# Patient Record
Sex: Female | Born: 1960 | Race: White | Hispanic: No | Marital: Single | Smoking: Never smoker
Health system: Southern US, Community
[De-identification: ages and names within clinical notes are randomized; demographics above are authoritative.]

## PROBLEM LIST (undated history)

## (undated) DIAGNOSIS — C801 Malignant (primary) neoplasm, unspecified: Secondary | ICD-10-CM

## (undated) DIAGNOSIS — Z923 Personal history of irradiation: Secondary | ICD-10-CM

## (undated) DIAGNOSIS — E785 Hyperlipidemia, unspecified: Secondary | ICD-10-CM

## (undated) DIAGNOSIS — J45909 Unspecified asthma, uncomplicated: Secondary | ICD-10-CM

## (undated) HISTORY — DX: Unspecified asthma, uncomplicated: J45.909

## (undated) HISTORY — PX: THIGH / KNEE SOFT TISSUE BIOPSY: SUR151

## (undated) HISTORY — DX: Hyperlipidemia, unspecified: E78.5

## (undated) HISTORY — PX: COLONOSCOPY: SHX174

## (undated) HISTORY — PX: TONSILLECTOMY: SUR1361

## (undated) HISTORY — DX: Malignant (primary) neoplasm, unspecified: C80.1

## (undated) HISTORY — PX: KNEE ARTHROSCOPY: SUR90

---

## 1998-07-20 HISTORY — PX: VARICOSE VEIN SURGERY: SHX832

## 2009-07-20 DIAGNOSIS — C801 Malignant (primary) neoplasm, unspecified: Secondary | ICD-10-CM

## 2009-07-20 HISTORY — DX: Malignant (primary) neoplasm, unspecified: C80.1

## 2009-07-20 HISTORY — PX: LITHOTRIPSY: SUR834

## 2010-07-20 DIAGNOSIS — Z923 Personal history of irradiation: Secondary | ICD-10-CM

## 2010-07-20 HISTORY — PX: BREAST SURGERY: SHX581

## 2010-07-20 HISTORY — DX: Personal history of irradiation: Z92.3

## 2010-07-20 HISTORY — PX: BREAST LUMPECTOMY: SHX2

## 2014-06-28 ENCOUNTER — Other Ambulatory Visit (HOSPITAL_COMMUNITY): Payer: Self-pay | Admitting: *Deleted

## 2014-06-28 DIAGNOSIS — Z9889 Other specified postprocedural states: Secondary | ICD-10-CM

## 2014-07-17 ENCOUNTER — Encounter (HOSPITAL_COMMUNITY): Payer: Self-pay

## 2014-07-24 ENCOUNTER — Encounter (HOSPITAL_COMMUNITY): Payer: Self-pay

## 2014-08-07 ENCOUNTER — Ambulatory Visit (HOSPITAL_COMMUNITY)
Admission: RE | Admit: 2014-08-07 | Discharge: 2014-08-07 | Disposition: A | Discharge status: Home / Self Care | Payer: PRIVATE HEALTH INSURANCE | Source: Ambulatory Visit | Attending: *Deleted | Admitting: *Deleted

## 2014-08-07 DIAGNOSIS — Z9889 Other specified postprocedural states: Secondary | ICD-10-CM

## 2014-08-07 DIAGNOSIS — Z1231 Encounter for screening mammogram for malignant neoplasm of breast: Secondary | ICD-10-CM | POA: Insufficient documentation

## 2014-11-29 ENCOUNTER — Other Ambulatory Visit (HOSPITAL_COMMUNITY): Payer: Self-pay | Admitting: Physician Assistant

## 2014-11-29 DIAGNOSIS — R609 Edema, unspecified: Secondary | ICD-10-CM

## 2014-11-29 DIAGNOSIS — M25531 Pain in right wrist: Secondary | ICD-10-CM

## 2014-11-29 DIAGNOSIS — T1490XA Injury, unspecified, initial encounter: Secondary | ICD-10-CM

## 2014-12-10 ENCOUNTER — Ambulatory Visit (HOSPITAL_COMMUNITY)
Admission: RE | Admit: 2014-12-10 | Discharge: 2014-12-10 | Disposition: A | Discharge status: Home / Self Care | Payer: Self-pay | Source: Ambulatory Visit | Attending: Physician Assistant | Admitting: Physician Assistant

## 2014-12-10 DIAGNOSIS — R609 Edema, unspecified: Secondary | ICD-10-CM | POA: Insufficient documentation

## 2014-12-10 DIAGNOSIS — T1490XA Injury, unspecified, initial encounter: Secondary | ICD-10-CM

## 2014-12-10 DIAGNOSIS — M25531 Pain in right wrist: Secondary | ICD-10-CM | POA: Insufficient documentation

## 2015-01-22 ENCOUNTER — Ambulatory Visit (INDEPENDENT_AMBULATORY_CARE_PROVIDER_SITE_OTHER): Payer: Self-pay

## 2015-01-22 ENCOUNTER — Ambulatory Visit (INDEPENDENT_AMBULATORY_CARE_PROVIDER_SITE_OTHER): Payer: Self-pay | Admitting: Orthopedic Surgery

## 2015-01-22 VITALS — BP 104/64 | Ht 63.0 in | Wt 129.6 lb

## 2015-01-22 DIAGNOSIS — M778 Other enthesopathies, not elsewhere classified: Secondary | ICD-10-CM

## 2015-01-22 DIAGNOSIS — M25531 Pain in right wrist: Secondary | ICD-10-CM

## 2015-01-22 MED ORDER — IBUPROFEN 800 MG PO TABS: 800.0000 mg | ORAL_TABLET | Freq: Three times a day (TID) | ORAL | Status: DC

## 2015-01-22 NOTE — Patient Instructions (Signed)
Call [facility] therapy dept to arrange visit for splint

## 2015-01-22 NOTE — Progress Notes (Addendum)
Patient ID: Brenda Olsen, female   DOB: June 15, 1961, 54 y.o.   MRN: [id number] Patient ID: Brenda Olsen, female   DOB: [birth year], 54 y.o.   MRN: [id number]  New  Chief Complaint  Patient presents with  . Wrist Pain    eval right wrist pain and swelling     Brenda Olsen is a 54 y.o. female.   HPI Resents with history of right wrist pain, she was shoveling in the yard several weeks ago and started having pain over the volar aspect of the hand and wrist area near the scaphoid tubercle and thumb and FCR tendon. She has painful range of motion no numbness or tingling. Her pain is unrelieved by ibuprofen. She did wear a wrap but no splints  Review of systems is negative for fever, erythema, numbness or tingling. Review of Systems See hpi  System review seasonal allergy sinusitis breathing issues wheezing ankle leg edema fatigue dizziness tingling she says occasionally in the thumb  She's had a knee scope in 2001 and 2003, vein stripping 2000, tumor removed right thigh in 1970 for tonsils out and 61 and breast cancer surgery in 2012  Medical history asthma and pneumonia osteoporosis and fractures in the past  Medications are Ventolin Advair disc and lovastatin  Allergies to sulfa and latex  Family history diabetes asthma heart disease hypertension or textural blood clot CHF kidney disease depression cancer arthritis osteoporosis anesthesia issues and immune disorders  Does not smoke or drink   Social History History  Substance Use Topics  . Smoking status: Not on file  . Smokeless tobacco: Not on file  . Alcohol Use: Not on file    Allergies not on file  Current Outpatient Prescriptions  Medication Sig Dispense Refill  . albuterol (PROVENTIL HFA;VENTOLIN HFA) 108 (90 BASE) MCG/ACT inhaler Inhale into the lungs every 6 (six) hours as needed for wheezing or shortness of breath.    . Fluticasone-Salmeterol (ADVAIR) 250-50 MCG/DOSE AEPB Inhale 1 puff into the lungs 2 (two) times  daily.    Marland Kitchen lovastatin (MEVACOR) 20 MG tablet Take 20 mg by mouth at bedtime.    Marland Kitchen ibuprofen (ADVIL,MOTRIN) 800 MG tablet Take 1 tablet (800 mg total) by mouth 3 (three) times daily. 90 tablet 1   No current facility-administered medications for this visit.       Physical Exam Blood pressure 104/64, height 5\' 3"  (1.6 m), weight 129 lb 9.6 oz (58.786 kg). Physical Exam The patient is well developed well nourished and well groomed. Orientation to person place and time is normal  Mood is pleasant. Ambulatory status normal she has normal range of motion mild pain with wrist supination mild tenderness over the scaphoid tubercle without instability. The rest of the wrist is nontender the FCR tendon is tender as it crosses the wrist but not in the forearm. The skin looks normal the sensation is normal pulses are good lymph nodes are negative  Data Reviewed  I ordered an x-ray I read that as normal. She had an MRI looked at that I read that is normal as well the report says that there is some tendinitis picture in the  Assessment Encounter Diagnoses  Name Primary?  . Right wrist pain   . Right wrist tendonitis Yes   She will need to have the thumb and wrist splinted in neutral for 6 weeks, continue 800 mg ibuprofen  Follow-up 6 weeks Plan As above

## 2015-01-24 ENCOUNTER — Encounter (HOSPITAL_COMMUNITY): Payer: Self-pay

## 2015-01-24 ENCOUNTER — Ambulatory Visit (HOSPITAL_COMMUNITY): Payer: Self-pay | Attending: Orthopedic Surgery

## 2015-01-24 DIAGNOSIS — M778 Other enthesopathies, not elsewhere classified: Secondary | ICD-10-CM | POA: Insufficient documentation

## 2015-01-24 DIAGNOSIS — M25532 Pain in left wrist: Secondary | ICD-10-CM | POA: Insufficient documentation

## 2015-01-24 NOTE — Patient Instructions (Signed)
Your Splint This splint should initially be fitted by a healthcare practitioner.  The healthcare practitioner is responsible for providing wearing instructions and precautions to the patient, other healthcare practitioners and care provider involved in the patient's care.  This splint was custom made for you. Please read the following instructions to learn about wearing and caring for your splint.  Precautions Should your splint cause any of the following problems, remove the splint immediately and contact your therapist/physician.  Swelling  Severe Pain  Pressure Areas  Stiffness  Numbness  Do not wear your splint while operating machinery unless it has been fabricated for that purpose.  When To Wear Your Splint Where your splint according to your therapist/physician instructions. All the time. You may remove your splint periodically during the day just to move your wrist, hand, and fingers gently to prevent stiffness.   Care and Cleaning of Your Splint 1. Keep your splint away from open flames. 2. Your splint will lose its shape in temperatures over 135 degrees Farenheit, ( in car windows, near radiators, ovens or in hot water).  Never make any adjustments to your splint, if the splint needs adjusting remove it and make an appointment to see your therapist. 3. Your splint, including the cushion liner may be cleaned with soap and lukewarm water.  Do not immerse in hot water over 135 degrees Farenheit. 4. Straps may be washed with soap and water, but do not moisten the self-adhesive portion. 5. For ink or hard to remove spots use a scouring cleanser which contains chlorine.  Rinse the splint thoroughly after using chlorine cleanser. 6.

## 2015-01-24 NOTE — Therapy (Signed)
[place], [place], [address] Phone: [phone]   Fax:  [phone]  Occupational Therapy Evaluation  Patient Details  Name: Brenda Olsen MRN: [id number] Date of Birth: May 30, 1961 Referring Provider:  Carole Civil, MD  Encounter Date: 01/24/2015      OT End of Session - 01/24/15 1751    Visit Number 1   Number of Visits 1   Authorization Type [facility] (free program through [facility])   OT Start Time 1350   OT Stop Time 1440   OT Time Calculation (min) 50 min   Activity Tolerance Patient tolerated treatment well   Behavior During Therapy [facility] for tasks assessed/performed      History reviewed. No pertinent past medical history.  No past surgical history on file.  There were no vitals filed for this visit.  Visit Diagnosis:  Left wrist tendonitis - Plan: Ot plan of care cert/re-cert  Wrist pain, acute, left - Plan: Ot plan of care cert/re-cert      Subjective Assessment - 01/24/15 1742    Subjective  S: I was using a garden shovel and I think it was just from overuse.    Pertinent History Patient is a 54 y/o female s/p right wrist tendonitis caused from overuse when using a garden shovel while doing yardwork. This event happened several weeks ago. Dr. Aline Olsen has referred patient to occupational therapy for wrist splint fabrication.   Patient Stated Goals A splint to provide support to her wrist that she'll be able to wear daily without discomfort.    Currently in Pain? Yes   Pain Score 5    Pain Location Wrist   Pain Orientation Right   Pain Descriptors / Indicators Constant;Sharp   Pain Type Acute pain   Pain Onset 1 to 4 weeks ago   Pain Frequency Intermittent           [facility] OT Assessment - 01/24/15 1746    Assessment   Diagnosis Right wrist tendonitis   Precautions   Precautions None   Restrictions   Weight Bearing Restrictions No   Balance Screen   Has the patient fallen in the  past 6 months No   Written Expression   Dominant Hand Right   Vision - History   Baseline Vision No visual deficits   Cognition   Overall Cognitive Status Within Functional Limits for tasks assessed   ROM / Strength   AROM / PROM / Strength AROM   AROM   Overall AROM Comments Right wrist, elbow, and forearm AROM WNL in all ranges. Patient does report pain with all movement and use.    AROM Assessment Site Wrist;Forearm;Elbow                  OT Treatments/Exercises (OP) - 01/24/15 1750    Splinting   Splinting OTR/L fabricated a right volar thumb spica splint with wrist and thumb splinted in nuetral position. Pt was educated on donning/doffing, cleaning, and precautions. Pt verbalized understanding.                OT Education - 01/24/15 1749    Education provided Yes   Education Details Splint wearing schedule, donning/doffing, and care management.    Person(s) Educated Patient   Methods Explanation;Demonstration;Handout   Comprehension Verbalized understanding          OT Short Term Goals - 01/24/15 1755    OT SHORT TERM GOAL #1   Title Patient will receive  fabricated right thumb spica splint and verbalize understanding of donning/doffing, cleaning/care management, and precautions.    Time 1   Period Days   Status Achieved                  Plan - 01/24/15 1752    Clinical Impression Statement A: Patient is a 54 y/o female s/p right wrist tendonitis causing increased pain and discomfort when completing all BADL especially manipulating shift gear when driving.    Pt will benefit from skilled therapeutic intervention in order to improve on the following deficits (Retired) Pain   Rehab Potential Excellent   OT Frequency 1x / week   OT Duration --  1 week   OT Treatment/Interventions Splinting   Plan P: 1 time visit for splint fabrication. OTR/L will follow up with patient on Monday 01/28/15 before discharging from therapy.    Consulted and  Agree with Plan of Care Patient        Problem List There are no active problems to display for this patient.   Brenda Olsen, OTR/L,CBIS  [phone]  01/24/2015, 6:01 PM  [facility] [address] [place], [place], [address] Phone: [phone]   Fax:  [phone]

## 2015-01-29 ENCOUNTER — Telehealth (HOSPITAL_COMMUNITY): Payer: Self-pay

## 2015-01-29 NOTE — Telephone Encounter (Signed)
Date: 01/28/15  Called patient to follow up on thumb spica splint that was fabricated in clinic on 01/24/15. No answer. Unable to leave message due to no answering system.   Ailene Ravel, OTR/L,CBIS  [phone]

## 2015-01-29 NOTE — Telephone Encounter (Signed)
Date: 01/29/15 2nd attempt to call patient to follow up on splint fabrication. No answer. No answering system to leave message.   Ailene Ravel, OTR/L,CBIS  [phone]

## 2015-02-01 ENCOUNTER — Encounter (HOSPITAL_COMMUNITY): Payer: Self-pay

## 2015-02-01 NOTE — Therapy (Signed)
[place], [place], [address] Phone: [phone]   Fax:  [phone]  Patient Details  Name: Brenda Olsen MRN: [id number] Date of Birth: 04/04/1961 Referring Provider:  No ref. provider found  Encounter Date: 02/01/2015  Two attempts to call patient and follow up on splint fabrication for right wrist. No answer with both phone calls. Unable to leave a message; no messaging system in place. Will discharge patient this date. Patient has not called and voiced any problems as she was encouraged to do if she experienced any increased pain, swelling, pressure areas, etc.   Ailene Ravel, OTR/L,CBIS  [phone]  02/01/2015, 8:36 AM  [place], [place], [address] Phone: [phone]   Fax:  [phone]

## 2015-03-05 ENCOUNTER — Ambulatory Visit (INDEPENDENT_AMBULATORY_CARE_PROVIDER_SITE_OTHER): Payer: Self-pay | Admitting: Orthopedic Surgery

## 2015-03-05 DIAGNOSIS — M778 Other enthesopathies, not elsewhere classified: Secondary | ICD-10-CM

## 2015-03-05 NOTE — Progress Notes (Signed)
Patient ID: Brenda Olsen, female   DOB: 09-07-1960, 54 y.o.   MRN: [id number]  Follow up visit  Chief Complaint  Patient presents with  . Follow-up    6 wk follow up right wrist s/p splint    BP 112/66 mmHg  Ht 5\' 3"  (1.6 m)  Wt 129 lb 9.6 oz (58.786 kg)  BMI 22.96 kg/m2  Encounter Diagnosis  Name Primary?  . Right wrist tendonitis     The patient's tendinitis has improved she does have some pain over the insertion of the FCR but her exam is essentially benign except for that tenderness. She can remove her splint use Max Vergie Living as needed follow-up as needed

## 2015-03-05 NOTE — Patient Instructions (Signed)
Okay to remove brace  maxfreeze or biofreeze three times daily

## 2015-07-25 ENCOUNTER — Ambulatory Visit: Payer: Self-pay | Admitting: Physician Assistant

## 2015-08-13 ENCOUNTER — Ambulatory Visit: Payer: Self-pay | Admitting: Physician Assistant

## 2015-08-13 ENCOUNTER — Encounter: Payer: Self-pay | Admitting: Physician Assistant

## 2015-08-13 VITALS — BP 98/62 | HR 75 | Temp 97.7°F | Ht 63.0 in | Wt 136.0 lb

## 2015-08-13 DIAGNOSIS — Z1239 Encounter for other screening for malignant neoplasm of breast: Secondary | ICD-10-CM

## 2015-08-13 DIAGNOSIS — J45909 Unspecified asthma, uncomplicated: Secondary | ICD-10-CM

## 2015-08-13 DIAGNOSIS — E785 Hyperlipidemia, unspecified: Secondary | ICD-10-CM | POA: Insufficient documentation

## 2015-08-13 NOTE — Progress Notes (Signed)
BP 98/62 mmHg  Pulse 75  Temp(Src) 97.7 F (36.5 C)  Ht 5\' 3"  (1.6 m)  Wt 136 lb (61.689 kg)  BMI 24.10 kg/m2  SpO2 97%   Subjective:    Patient ID: Brenda Olsen, female    DOB: [birth year], 55 y.o.   MRN: [id number]  HPI: Brenda Olsen is a 55 y.o. female presenting on 08/13/2015 for Hyperlipidemia   HPI   Pt says she is doing well.  She is still living and caring for her mother.  She says her asthma is well-controlled and she isn't having problems with sob or wheezing.  Pt did not get her labs drawn.  Relevant past medical, surgical, family and social history reviewed and updated as indicated. Interim medical history since our last visit reviewed. Allergies and medications reviewed and updated.  Current outpatient prescriptions:  .  albuterol (PROVENTIL HFA;VENTOLIN HFA) 108 (90 BASE) MCG/ACT inhaler, Inhale into the lungs every 6 (six) hours as needed for wheezing or shortness of breath., Disp: , Rfl:  .  Fluticasone-Salmeterol (ADVAIR) 250-50 MCG/DOSE AEPB, Inhale 1 puff into the lungs 2 (two) times daily., Disp: , Rfl:  .  ibuprofen (ADVIL,MOTRIN) 800 MG tablet, Take 1 tablet (800 mg total) by mouth 3 (three) times daily. (Patient taking differently: Take 800 mg by mouth as needed (joint pain). ), Disp: 90 tablet, Rfl: 1 .  lovastatin (MEVACOR) 20 MG tablet, Take 20 mg by mouth at bedtime., Disp: , Rfl:    Review of Systems  Constitutional: Negative for fever, chills, diaphoresis, appetite change, fatigue and unexpected weight change.  HENT: Negative for congestion, dental problem, drooling, ear pain, facial swelling, hearing loss, mouth sores, sneezing, sore throat, trouble swallowing and voice change.   Eyes: Negative for pain, discharge, redness, itching and visual disturbance.  Respiratory: Negative for cough, choking, shortness of breath and wheezing.   Cardiovascular: Negative for chest pain, palpitations and leg swelling.  Gastrointestinal: Negative for vomiting,  abdominal pain, diarrhea, constipation and blood in stool.  Endocrine: Negative for cold intolerance, heat intolerance and polydipsia.  Genitourinary: Negative for dysuria, hematuria and decreased urine volume.  Musculoskeletal: Positive for back pain and arthralgias. Negative for gait problem.  Skin: Negative for rash.  Allergic/Immunologic: Positive for environmental allergies.  Neurological: Negative for seizures, syncope, light-headedness and headaches.  Hematological: Negative for adenopathy.  Psychiatric/Behavioral: Negative for suicidal ideas, dysphoric mood and agitation. The patient is not nervous/anxious.     Per HPI unless specifically indicated above     Objective:    BP 98/62 mmHg  Pulse 75  Temp(Src) 97.7 F (36.5 C)  Ht 5\' 3"  (1.6 m)  Wt 136 lb (61.689 kg)  BMI 24.10 kg/m2  SpO2 97%  Wt Readings from Last 3 Encounters:  08/13/15 136 lb (61.689 kg)  03/05/15 129 lb 9.6 oz (58.786 kg)  01/22/15 129 lb 9.6 oz (58.786 kg)    Physical Exam  Constitutional: She is oriented to person, place, and time. She appears well-developed and well-nourished.  HENT:  Head: Normocephalic and atraumatic.  Neck: Neck supple.  Cardiovascular: Normal rate and regular rhythm.   Pulmonary/Chest: Effort normal and breath sounds normal. No respiratory distress. She has no wheezes.  Abdominal: Soft. Bowel sounds are normal. She exhibits no distension and no mass. There is no tenderness.  Musculoskeletal: She exhibits no edema.  Lymphadenopathy:    She has no cervical adenopathy.  Neurological: She is alert and oriented to person, place, and time.  Skin: Skin is warm and  dry.  Psychiatric: She has a normal mood and affect. Her behavior is normal.  Vitals reviewed.   No results found for this or any previous visit.    Assessment & Plan:   Encounter Diagnoses  Name Primary?  . Hyperlipidemia Yes  . Asthma, chronic, unspecified asthma severity, uncomplicated   . Screening for  breast cancer     -order mammogram -encouraged pt to sign up for PAP screening at free clinic held at [facility] -get fasting labs drawn tomorrow. Will call with results. -continue current medications -f/u 4 months.  RTO sooner prn

## 2015-08-14 ENCOUNTER — Other Ambulatory Visit: Payer: Self-pay | Admitting: Physician Assistant

## 2015-08-14 LAB — COMPREHENSIVE METABOLIC PANEL
ALT: 26 U/L (ref 6–29)
AST: 24 U/L (ref 10–35)
Albumin: 3.8 g/dL (ref 3.6–5.1)
Alkaline Phosphatase: 74 U/L (ref 33–130)
BUN: 18 mg/dL (ref 7–25)
CALCIUM: 9 mg/dL (ref 8.6–10.4)
CO2: 27 mmol/L (ref 20–31)
Chloride: 104 mmol/L (ref 98–110)
Creat: 0.79 mg/dL (ref 0.50–1.05)
Glucose, Bld: 88 mg/dL (ref 65–99)
POTASSIUM: 4.2 mmol/L (ref 3.5–5.3)
Sodium: 140 mmol/L (ref 135–146)
Total Bilirubin: 1.5 mg/dL — ABNORMAL HIGH (ref 0.2–1.2)
Total Protein: 6.5 g/dL (ref 6.1–8.1)

## 2015-08-14 LAB — LIPID PANEL
Cholesterol: 201 mg/dL — ABNORMAL HIGH (ref 125–200)
HDL: 76 mg/dL (ref >=46)
LDL CALC: 112 mg/dL (ref <130)
TRIGLYCERIDES: 66 mg/dL (ref <150)
Total CHOL/HDL Ratio: 2.6 Ratio (ref <=5.0)
VLDL: 13 mg/dL (ref <30)

## 2015-09-02 ENCOUNTER — Other Ambulatory Visit: Payer: Self-pay | Admitting: Physician Assistant

## 2015-09-02 MED ORDER — FLUTICASONE-SALMETEROL 250-50 MCG/DOSE IN AEPB: 1.0000 | INHALATION_SPRAY | Freq: Two times a day (BID) | RESPIRATORY_TRACT | Status: DC

## 2015-09-10 ENCOUNTER — Other Ambulatory Visit (HOSPITAL_COMMUNITY): Payer: Self-pay | Admitting: *Deleted

## 2015-09-10 DIAGNOSIS — Z9889 Other specified postprocedural states: Secondary | ICD-10-CM

## 2015-09-17 ENCOUNTER — Encounter: Payer: Self-pay | Admitting: Physician Assistant

## 2015-09-17 ENCOUNTER — Ambulatory Visit (HOSPITAL_COMMUNITY)
Admission: RE | Admit: 2015-09-17 | Discharge: 2015-09-17 | Disposition: A | Discharge status: Home / Self Care | Payer: PRIVATE HEALTH INSURANCE | Source: Ambulatory Visit | Attending: Physician Assistant | Admitting: Physician Assistant

## 2015-09-17 ENCOUNTER — Ambulatory Visit: Payer: Self-pay | Admitting: Physician Assistant

## 2015-09-17 ENCOUNTER — Ambulatory Visit (HOSPITAL_COMMUNITY)
Admission: RE | Admit: 2015-09-17 | Discharge: 2015-09-17 | Disposition: A | Discharge status: Home / Self Care | Payer: PRIVATE HEALTH INSURANCE | Source: Ambulatory Visit | Attending: *Deleted | Admitting: *Deleted

## 2015-09-17 VITALS — BP 114/60 | HR 79 | Temp 98.1°F | Ht 63.0 in | Wt 136.2 lb

## 2015-09-17 DIAGNOSIS — Z853 Personal history of malignant neoplasm of breast: Secondary | ICD-10-CM | POA: Insufficient documentation

## 2015-09-17 DIAGNOSIS — S99921A Unspecified injury of right foot, initial encounter: Secondary | ICD-10-CM | POA: Diagnosis not present

## 2015-09-17 DIAGNOSIS — W228XXA Striking against or struck by other objects, initial encounter: Secondary | ICD-10-CM | POA: Insufficient documentation

## 2015-09-17 DIAGNOSIS — Y929 Unspecified place or not applicable: Secondary | ICD-10-CM | POA: Insufficient documentation

## 2015-09-17 DIAGNOSIS — Z9889 Other specified postprocedural states: Secondary | ICD-10-CM

## 2015-09-17 DIAGNOSIS — M79671 Pain in right foot: Secondary | ICD-10-CM

## 2015-09-17 DIAGNOSIS — Z923 Personal history of irradiation: Secondary | ICD-10-CM | POA: Insufficient documentation

## 2015-09-17 NOTE — Progress Notes (Signed)
   BP 114/60 mmHg  Pulse 79  Temp(Src) 98.1 F (36.7 C)  Ht 5\' 3"  (1.6 m)  Wt 136 lb 3.2 oz (61.78 kg)  BMI 24.13 kg/m2  SpO2 99%   Subjective:    Patient ID: Brenda Olsen, female    DOB: 10-18-1960, 55 y.o.   MRN: [id number]  HPI: Brenda Olsen is a 55 y.o. female presenting on 09/17/2015 for Foot Pain   HPI Chief Complaint  Patient presents with  . Foot Pain    w/ swelling. R foot    Pt dropped a piece of wood on her foot.  This happened one day last week.  Relevant past medical, surgical, family and social history reviewed and updated as indicated. Interim medical history since our last visit reviewed. Allergies and medications reviewed and updated.   Current outpatient prescriptions:  .  albuterol (PROVENTIL HFA;VENTOLIN HFA) 108 (90 BASE) MCG/ACT inhaler, Inhale into the lungs every 6 (six) hours as needed for wheezing or shortness of breath., Disp: , Rfl:  .  Fluticasone-Salmeterol (ADVAIR) 250-50 MCG/DOSE AEPB, Inhale 1 puff into the lungs 2 (two) times daily., Disp: 180 each, Rfl: 3 .  lovastatin (MEVACOR) 20 MG tablet, Take 20 mg by mouth at bedtime., Disp: , Rfl:    Review of Systems  Constitutional: Negative for fever, chills, diaphoresis, appetite change, fatigue and unexpected weight change.  HENT: Negative for congestion, dental problem, drooling, ear pain, facial swelling, hearing loss, mouth sores, sneezing, sore throat, trouble swallowing and voice change.   Eyes: Negative for pain, discharge, redness, itching and visual disturbance.  Respiratory: Negative for cough, choking, shortness of breath and wheezing.   Cardiovascular: Negative for chest pain, palpitations and leg swelling.  Gastrointestinal: Negative for vomiting, abdominal pain, diarrhea, constipation and blood in stool.  Endocrine: Negative for cold intolerance, heat intolerance and polydipsia.  Genitourinary: Negative for dysuria, hematuria and decreased urine volume.  Musculoskeletal: Positive  for arthralgias and gait problem. Negative for back pain.  Skin: Negative for rash.  Allergic/Immunologic: Positive for environmental allergies.  Neurological: Negative for seizures, syncope, light-headedness and headaches.  Hematological: Negative for adenopathy.  Psychiatric/Behavioral: Negative for suicidal ideas, dysphoric mood and agitation. The patient is not nervous/anxious.     Per HPI unless specifically indicated above     Objective:    BP 114/60 mmHg  Pulse 79  Temp(Src) 98.1 F (36.7 C)  Ht 5\' 3"  (1.6 m)  Wt 136 lb 3.2 oz (61.78 kg)  BMI 24.13 kg/m2  SpO2 99%  Wt Readings from Last 3 Encounters:  09/17/15 136 lb 3.2 oz (61.78 kg)  08/13/15 136 lb (61.689 kg)  03/05/15 129 lb 9.6 oz (58.786 kg)    Physical Exam  Constitutional: She is oriented to person, place, and time. She appears well-developed and well-nourished.  Musculoskeletal:       Right foot: There is tenderness and bony tenderness. There is normal range of motion, no swelling and normal capillary refill.  Neurological: She is alert and oriented to person, place, and time.  Psychiatric: She has a normal mood and affect. Her behavior is normal.  Vitals reviewed.       Assessment & Plan:     Encounter Diagnosis  Name Primary?  . Right foot pain Yes   -Xray the foot to r/o fx -Pt is given [facility] discount application. -Pt counseled to ice, elevate, ibuprofen.  Also wear good supportive shoe like a sneaker

## 2015-09-18 ENCOUNTER — Other Ambulatory Visit: Payer: Self-pay | Admitting: Physician Assistant

## 2015-11-07 ENCOUNTER — Other Ambulatory Visit: Payer: Self-pay | Admitting: Physician Assistant

## 2015-11-07 MED ORDER — ALBUTEROL SULFATE HFA 108 (90 BASE) MCG/ACT IN AERS: 2.0000 | INHALATION_SPRAY | Freq: Four times a day (QID) | RESPIRATORY_TRACT | Status: DC | PRN

## 2015-12-11 ENCOUNTER — Encounter: Payer: Self-pay | Admitting: Physician Assistant

## 2015-12-11 ENCOUNTER — Ambulatory Visit: Payer: Self-pay | Admitting: Physician Assistant

## 2015-12-11 VITALS — BP 100/60 | HR 87 | Temp 97.5°F | Ht 63.0 in | Wt 137.2 lb

## 2015-12-11 DIAGNOSIS — M79671 Pain in right foot: Secondary | ICD-10-CM

## 2015-12-11 DIAGNOSIS — J45909 Unspecified asthma, uncomplicated: Secondary | ICD-10-CM

## 2015-12-11 DIAGNOSIS — E785 Hyperlipidemia, unspecified: Secondary | ICD-10-CM

## 2015-12-11 NOTE — Patient Instructions (Signed)
Record request for colonoscopy report- refer to GI for repeat colonoscopy when report comes in  Labs- call results Will have nurse contact A[facility]-= pt turned in CD before xray Repeat xray foot- if good will send to Physical therapy -f/u 4 mo- PAP at that appt

## 2015-12-11 NOTE — Progress Notes (Signed)
BP 100/60 mmHg  Pulse 87  Temp(Src) 97.5 F (36.4 C)  Ht 5\' 3"  (1.6 m)  Wt 137 lb 3.2 oz (62.234 kg)  BMI 24.31 kg/m2  SpO2 97%   Subjective:    Patient ID: Brenda Olsen, female    DOB: [birth year], 55 y.o.   MRN: [id number]  HPI: Brenda Olsen is a 55 y.o. female presenting on 12/11/2015 for Asthma and Hyperlipidemia   HPI   Pt had colonoscopy at age 72.  She says she had several polyps.   She says she tried to have PAP done earlier this year but the person "was having a hard time" and so it wasn't done.    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 Interim medical history since our last visit reviewed. Allergies and medications reviewed and updated.   Current outpatient prescriptions:  .  albuterol (PROVENTIL HFA;VENTOLIN HFA) 108 (90 Base) MCG/ACT inhaler, Inhale 2 puffs into the lungs every 6 (six) hours as needed for wheezing or shortness of breath., Disp: 3 Inhaler, Rfl: 3 .  Fluticasone-Salmeterol (ADVAIR) 250-50 MCG/DOSE AEPB, Inhale 1 puff into the lungs 2 (two) times daily., Disp: 180 each, Rfl: 3 .  Ibuprofen 200 MG CAPS, Take 400 mg by mouth 2 (two) times daily as needed., Disp: , Rfl:  .  lovastatin (MEVACOR) 20 MG tablet, TAKE ONE TABLET BY MOUTH ONCE DAILY AT BEDTIME FOR CHOLESTEROL, Disp: 30 tablet, Rfl: 6   Review of Systems  Constitutional: Negative for fever, chills, diaphoresis, appetite change, fatigue and unexpected weight change.  HENT: Negative for congestion, dental problem, drooling, ear pain, facial swelling, hearing loss, mouth sores, sneezing, sore throat, trouble swallowing and voice change.   Eyes: Negative for pain, discharge, redness, itching and visual disturbance.  Respiratory: Negative for cough, choking, shortness of breath and wheezing.   Cardiovascular: Negative for chest pain, palpitations and leg swelling.  Gastrointestinal: Negative  for vomiting, abdominal pain, diarrhea, constipation and blood in stool.  Endocrine: Negative for cold intolerance, heat intolerance and polydipsia.  Genitourinary: Negative for dysuria, hematuria and decreased urine volume.  Musculoskeletal: Positive for arthralgias and gait problem. Negative for back pain.  Skin: Negative for rash.  Allergic/Immunologic: Positive for environmental allergies.  Neurological: Negative for seizures, syncope, light-headedness and headaches.  Hematological: Negative for adenopathy.  Psychiatric/Behavioral: Negative for suicidal ideas, dysphoric mood and agitation. The patient is not nervous/anxious.     Per HPI unless specifically indicated above     Objective:    BP 100/60 mmHg  Pulse 87  Temp(Src) 97.5 F (36.4 C)  Ht 5\' 3"  (1.6 m)  Wt 137 lb 3.2 oz (62.234 kg)  BMI 24.31 kg/m2  SpO2 97%  Wt Readings from Last 3 Encounters:  12/11/15 137 lb 3.2 oz (62.234 kg)  09/17/15 136 lb 3.2 oz (61.78 kg)  08/13/15 136 lb (61.689 kg)    Physical Exam  Constitutional: She is oriented to person, place, and time. She appears well-developed and well-nourished.  HENT:  Head: Normocephalic and atraumatic.  Neck: Neck supple.  Cardiovascular: Normal rate and regular rhythm.   Pulmonary/Chest: Effort normal and breath sounds normal.  Abdominal: Soft. Bowel sounds are normal. She exhibits no mass. There is no hepatosplenomegaly. There is no tenderness.  Musculoskeletal: She exhibits no edema.  Lymphadenopathy:    She has no cervical adenopathy.  Neurological: She is alert and oriented to person, place, and time.  Skin: Skin is warm and dry.  Psychiatric: She has a normal mood and affect. Her behavior is normal.  Vitals reviewed.       Assessment & Plan:   Encounter Diagnoses  Name Primary?  . Hyperlipidemia Yes  . Asthma, chronic, unspecified asthma severity, uncomplicated   . Right foot pain      -Record request for colonoscopy report sent- Will  refer to GI for repeat colonoscopy when report comes in  -pt to get Labs drawn this week- Will call results -Will have nurse contact [name] about [facility] discount application.  pt turned in CD before xray but says she hasn't heard on it yet -Repeat xray foot- if good will send to Physical therapy -f/u 4 months- for cholesterol and  PAP at that appointment

## 2015-12-12 ENCOUNTER — Other Ambulatory Visit: Payer: Self-pay | Admitting: Physician Assistant

## 2015-12-12 ENCOUNTER — Ambulatory Visit (HOSPITAL_COMMUNITY)
Admission: RE | Admit: 2015-12-12 | Discharge: 2015-12-12 | Disposition: A | Discharge status: Home / Self Care | Payer: Self-pay | Source: Ambulatory Visit | Attending: Physician Assistant | Admitting: Physician Assistant

## 2015-12-12 DIAGNOSIS — Z86010 Personal history of colonic polyps: Secondary | ICD-10-CM

## 2015-12-12 DIAGNOSIS — M79671 Pain in right foot: Secondary | ICD-10-CM | POA: Insufficient documentation

## 2015-12-12 LAB — LIPID PANEL
CHOL/HDL RATIO: 2.5 ratio (ref <=5.0)
Cholesterol: 185 mg/dL (ref 125–200)
HDL: 74 mg/dL (ref >=46)
LDL Cholesterol: 98 mg/dL (ref <130)
Triglycerides: 63 mg/dL (ref <150)
VLDL: 13 mg/dL (ref <30)

## 2015-12-12 LAB — COMPLETE METABOLIC PANEL WITH GFR
ALT: 28 U/L (ref 6–29)
AST: 25 U/L (ref 10–35)
Albumin: 3.8 g/dL (ref 3.6–5.1)
Alkaline Phosphatase: 68 U/L (ref 33–130)
BUN: 15 mg/dL (ref 7–25)
CO2: 25 mmol/L (ref 20–31)
Calcium: 9.1 mg/dL (ref 8.6–10.4)
Chloride: 108 mmol/L (ref 98–110)
Creat: 0.8 mg/dL (ref 0.50–1.05)
GFR, Est African American: >89 mL/min (ref >=60)
GFR, Est Non African American: 83 mL/min (ref >=60)
Glucose, Bld: 92 mg/dL (ref 65–99)
Potassium: 4.3 mmol/L (ref 3.5–5.3)
Sodium: 142 mmol/L (ref 135–146)
Total Bilirubin: 1.2 mg/dL (ref 0.2–1.2)
Total Protein: 6.3 g/dL (ref 6.1–8.1)

## 2015-12-15 ENCOUNTER — Other Ambulatory Visit: Payer: Self-pay | Admitting: Physician Assistant

## 2015-12-15 DIAGNOSIS — M79671 Pain in right foot: Secondary | ICD-10-CM

## 2015-12-17 ENCOUNTER — Encounter: Payer: Self-pay | Admitting: Gastroenterology

## 2015-12-30 ENCOUNTER — Ambulatory Visit: Payer: Self-pay | Admitting: Nurse Practitioner

## 2016-01-02 ENCOUNTER — Encounter: Payer: Self-pay | Admitting: Physician Assistant

## 2016-01-15 ENCOUNTER — Ambulatory Visit: Payer: Self-pay | Admitting: Nurse Practitioner

## 2016-02-11 ENCOUNTER — Ambulatory Visit (INDEPENDENT_AMBULATORY_CARE_PROVIDER_SITE_OTHER): Payer: Self-pay | Admitting: Nurse Practitioner

## 2016-02-11 ENCOUNTER — Encounter: Payer: Self-pay | Admitting: Nurse Practitioner

## 2016-02-11 ENCOUNTER — Other Ambulatory Visit: Payer: Self-pay

## 2016-02-11 VITALS — BP 100/66 | HR 85 | Temp 98.1°F | Ht 62.0 in | Wt 135.0 lb

## 2016-02-11 DIAGNOSIS — Z1211 Encounter for screening for malignant neoplasm of colon: Secondary | ICD-10-CM

## 2016-02-11 DIAGNOSIS — K219 Gastro-esophageal reflux disease without esophagitis: Secondary | ICD-10-CM

## 2016-02-11 DIAGNOSIS — R1310 Dysphagia, unspecified: Secondary | ICD-10-CM

## 2016-02-11 MED ORDER — OMEPRAZOLE 20 MG PO CPDR: 20.0000 mg | DELAYED_RELEASE_CAPSULE | Freq: Every day | ORAL | 3 refills | Status: DC

## 2016-02-11 NOTE — Progress Notes (Addendum)
REVIEWED-NO ADDITIONAL RECOMMENDATIONS.  Primary Care Physician:  Soyla Dryer, PA-C Primary Gastroenterologist:  Dr. Oneida Alar  Chief Complaint  Patient presents with  . Colonoscopy    Hx of polyps    HPI:   [name] is a 55 y.o. female who presents On referral from primary care for ectasia colonoscopy due to a history of colon polyps. PCP notes reviewed. An last saw PCP 12/11/2015 at which point it was determined her records to be requested and referred to GI for follow-up one last colonoscopy report was received. Reviewed previous colonoscopy report completed 12/11/2010 at [facility] in [place], [place]. Findings include 2 sessile polyps at the hepatic flexure, sessile polyp in the ascending colon. EGD completed the same time found a few erosions in the lower third of the esophagus and GE junction status post biopsy, hiatal hernia, nonbleeding erosive gastropathy status post biopsy, antral gastric mucosal abnormality status post biopsy, normal examined duodenum, Widely patent Schatzki's ring. Surgical pathology found the colon polyps to be tubular adenoma, antral stomach biopsy mild reactive gastropathy, GE junction biopsy gastric type mucosa with mild chronic inflammation without Barrett's esophagus.  Today she states she's doing well overall. Takes Ibuprofen or baby ASA almost every day. Has GERD symptoms regularly usually couple times a week, takes [place]. Admits dysphagia symptoms about 2-3 times a month. Denies abdominal pain, hematochezia, melena, unintentional weight loss, fever, chills, acute changes in bowel habits. Denies chest pain, dyspnea, dizziness, lightheadedness, syncope, near syncope. Denies any other upper or lower GI symptoms.  Past Medical History:  Diagnosis Date  . Asthma   . Cancer [facility]) 2011   breast  . Hyperlipidemia     Past Surgical History:  Procedure Laterality Date  . BREAST SURGERY Left 2012   lumpectomy- followed by radiation x 6 wk    . COLONOSCOPY  age 86  . KNEE ARTHROSCOPY Bilateral 2001, 2003  . LITHOTRIPSY  2011  . THIGH / KNEE SOFT TISSUE BIOPSY Right 55 yo   benign tumor removed L thigh  . TONSILLECTOMY  childhood  . VARICOSE VEIN SURGERY Right 2000    Current Outpatient Prescriptions  Medication Sig Dispense Refill  . albuterol (PROVENTIL HFA;VENTOLIN HFA) 108 (90 Base) MCG/ACT inhaler Inhale 2 puffs into the lungs every 6 (six) hours as needed for wheezing or shortness of breath. 3 Inhaler 3  . Fluticasone-Salmeterol (ADVAIR) 250-50 MCG/DOSE AEPB Inhale 1 puff into the lungs 2 (two) times daily. 180 each 3  . Ibuprofen 200 MG CAPS Take 400 mg by mouth 2 (two) times daily as needed.    . lovastatin (MEVACOR) 20 MG tablet TAKE ONE TABLET BY MOUTH ONCE DAILY AT BEDTIME FOR CHOLESTEROL 30 tablet 6   No current facility-administered medications for this visit.     Allergies as of 02/11/2016 - Review Complete 02/11/2016  Allergen Reaction Noted  . Sulfa antibiotics Hives 08/13/2015    Family History  Problem Relation Age of Onset  . Scleroderma Mother   . Lupus Mother   . Heart disease Mother   . Alzheimer's disease Father   . Heart disease Brother   . Stroke Brother   . Cancer Brother     lung  . Colon cancer Neg Hx     Social History   Social History  . Marital status: Single    Spouse name: N/A  . Number of children: N/A  . Years of education: N/A   Occupational History  . Not on file.   Social History Main Topics  .  Smoking status: Never Smoker  . Smokeless tobacco: Never Used  . Alcohol use No  . Drug use: No  . Sexual activity: Not on file   Other Topics Concern  . Not on file   Social History Narrative  . No narrative on file    Review of Systems: 10-point ROS negative except as per HPI.    Physical Exam: BP 100/66   Pulse 85   Temp 98.1 F (36.7 C) (Oral)   Ht 5\' 2"  (1.575 m)   Wt 135 lb (61.2 kg)   BMI 24.69 kg/m  General:   Alert and oriented. Pleasant and  cooperative. Well-nourished and well-developed.  Head:  Normocephalic and atraumatic.  Ears:  Normal auditory acuity. Mouth:  No deformity or lesions, oral mucosa pink. Oropharynx widely patent. Throat/Neck:  Supple, without mass or thyromegaly. Cardiovascular:  S1, S2 present without murmurs appreciated. Extremities without clubbing or edema. Respiratory:  Clear to auscultation bilaterally. No wheezes, rales, or rhonchi. No distress.  Gastrointestinal:  +BS, soft, non-tender and non-distended. No HSM noted. No guarding or rebound. No masses appreciated.  Rectal:  Deferred  Musculoskalatal:  Symmetrical without gross deformities. Neurologic:  Alert and oriented x4;  grossly normal neurologically. Psych:  Alert and cooperative. Normal mood and affect. Heme/Lymph/Immune: No excessive bruising noted.    02/11/2016 8:34 AM   Disclaimer: This note was dictated with voice recognition software. Similar sounding words can inadvertently be transcribed and may not be corrected upon review.

## 2016-02-11 NOTE — Assessment & Plan Note (Signed)
Patient with complaints of dysphagia. Last endoscopy report completed in 2012 found widely patent Schatzki's ring not dilated. Given persistent GERD, regular use of NSAIDs her dysphagia is likely due to uncontrolled esophagitis/GERD, however cannot rule out stricture, web, ring, mass. No Barrett's esophagus on her last endoscopy. We will had endoscopy to her needed colonoscopy. Return for follow-up in 3 months.  Proceed with EGD +/- dilation with Dr. Oneida Alar in near future: the risks, benefits, and alternatives have been discussed with the patient in detail. The patient states understanding and desires to proceed.  The patient is not on any anticoagulants, anxiolytics, chronic pain medications, or antidepressants. Conscious sedation should be adequate for her procedure.

## 2016-02-11 NOTE — Assessment & Plan Note (Signed)
Patient due for repeat colonoscopy. Last colonoscopy found 3 sessile polyps which were found to be tubular adenoma on surgical pathology with recommended 5 year repeat. She is currently due. She is generally asymptomatic from a lower GI standpoint. We will proceed with screening colonoscopy as recommended. Return for follow-up in 3 months for upper GI symptom progression as noted above.  Proceed with colonoscopy with Dr. Oneida Alar in the near future. The risks, benefits, and alternatives have been discussed in detail with the patient. They state understanding and desire to proceed.   The patient is not on any anticoagulants, anxiolytics, chronic pain medications, or antidepressants. Conscious sedation should be adequate for her procedure.

## 2016-02-11 NOTE — Patient Instructions (Signed)
1. I send Prilosec to your pharmacy. Take it once a day, 30 minutes before your first meal the day. 2. As we discussed, try to avoid all "NSAID" medications. These are generally over-the-counter pain medications except Tylenol. Any medication with an NSAID and it should be labeled "contains NSAIDs " 3. We will schedule your procedure for you.  4. Return for follow-up in 3 months.

## 2016-02-11 NOTE — Progress Notes (Signed)
Cc'ed to pcp °

## 2016-02-11 NOTE — Assessment & Plan Note (Signed)
Patient notes GERD symptoms regularly, a couple times a week. Does take NSAIDs for arthritis/joint pain. I have advised her to avoid NSAIDs as much as possible. I'm also starting her on Prilosec 20 mg once daily, return for follow-up in 3 months. We will also arrange for an upper endoscopy per below.

## 2016-02-17 ENCOUNTER — Other Ambulatory Visit: Payer: Self-pay

## 2016-02-17 DIAGNOSIS — K219 Gastro-esophageal reflux disease without esophagitis: Secondary | ICD-10-CM

## 2016-02-17 DIAGNOSIS — R1310 Dysphagia, unspecified: Secondary | ICD-10-CM

## 2016-02-17 DIAGNOSIS — Z1211 Encounter for screening for malignant neoplasm of colon: Secondary | ICD-10-CM

## 2016-02-20 ENCOUNTER — Telehealth: Payer: Self-pay | Admitting: Gastroenterology

## 2016-02-20 NOTE — Telephone Encounter (Signed)
Pt called asking to speak with office manager since GF wasn't available. She is scheduled a colonoscopy for tomorrow and she is following up about her insurance and what it'll cover because the pre service center told her she would have to pay $1300 upfront. She asked for CM to call her before 1245 today. [phone]

## 2016-02-20 NOTE — Telephone Encounter (Signed)
I tried to call the patient, no answer,lmom 

## 2016-02-21 ENCOUNTER — Encounter (HOSPITAL_COMMUNITY)
Admission: RE | Disposition: A | Discharge status: Home / Self Care | Payer: Self-pay | Source: Ambulatory Visit | Attending: Gastroenterology

## 2016-02-21 ENCOUNTER — Encounter (HOSPITAL_COMMUNITY): Payer: Self-pay | Admitting: *Deleted

## 2016-02-21 ENCOUNTER — Ambulatory Visit (HOSPITAL_COMMUNITY)
Admission: RE | Admit: 2016-02-21 | Discharge: 2016-02-21 | Disposition: A | Discharge status: Home / Self Care | Payer: Self-pay | Source: Ambulatory Visit | Attending: Gastroenterology | Admitting: Gastroenterology

## 2016-02-21 DIAGNOSIS — Z1211 Encounter for screening for malignant neoplasm of colon: Secondary | ICD-10-CM

## 2016-02-21 DIAGNOSIS — K219 Gastro-esophageal reflux disease without esophagitis: Secondary | ICD-10-CM

## 2016-02-21 DIAGNOSIS — K449 Diaphragmatic hernia without obstruction or gangrene: Secondary | ICD-10-CM | POA: Insufficient documentation

## 2016-02-21 DIAGNOSIS — K648 Other hemorrhoids: Secondary | ICD-10-CM | POA: Insufficient documentation

## 2016-02-21 DIAGNOSIS — Z79899 Other long term (current) drug therapy: Secondary | ICD-10-CM | POA: Insufficient documentation

## 2016-02-21 DIAGNOSIS — K319 Disease of stomach and duodenum, unspecified: Secondary | ICD-10-CM | POA: Insufficient documentation

## 2016-02-21 DIAGNOSIS — J45909 Unspecified asthma, uncomplicated: Secondary | ICD-10-CM | POA: Insufficient documentation

## 2016-02-21 DIAGNOSIS — Z86010 Personal history of colonic polyps: Secondary | ICD-10-CM | POA: Insufficient documentation

## 2016-02-21 DIAGNOSIS — E785 Hyperlipidemia, unspecified: Secondary | ICD-10-CM | POA: Insufficient documentation

## 2016-02-21 DIAGNOSIS — K5730 Diverticulosis of large intestine without perforation or abscess without bleeding: Secondary | ICD-10-CM | POA: Insufficient documentation

## 2016-02-21 DIAGNOSIS — Z853 Personal history of malignant neoplasm of breast: Secondary | ICD-10-CM | POA: Insufficient documentation

## 2016-02-21 DIAGNOSIS — Z840 Family history of diseases of the skin and subcutaneous tissue: Secondary | ICD-10-CM | POA: Insufficient documentation

## 2016-02-21 DIAGNOSIS — Z809 Family history of malignant neoplasm, unspecified: Secondary | ICD-10-CM | POA: Insufficient documentation

## 2016-02-21 DIAGNOSIS — Z882 Allergy status to sulfonamides status: Secondary | ICD-10-CM | POA: Insufficient documentation

## 2016-02-21 DIAGNOSIS — Z820 Family history of epilepsy and other diseases of the nervous system: Secondary | ICD-10-CM | POA: Insufficient documentation

## 2016-02-21 DIAGNOSIS — Q438 Other specified congenital malformations of intestine: Secondary | ICD-10-CM | POA: Insufficient documentation

## 2016-02-21 DIAGNOSIS — Z83518 Family history of other specified eye disorder: Secondary | ICD-10-CM | POA: Insufficient documentation

## 2016-02-21 DIAGNOSIS — K222 Esophageal obstruction: Secondary | ICD-10-CM

## 2016-02-21 DIAGNOSIS — T39315A Adverse effect of propionic acid derivatives, initial encounter: Secondary | ICD-10-CM | POA: Insufficient documentation

## 2016-02-21 DIAGNOSIS — R1013 Epigastric pain: Secondary | ICD-10-CM | POA: Insufficient documentation

## 2016-02-21 DIAGNOSIS — R1310 Dysphagia, unspecified: Secondary | ICD-10-CM

## 2016-02-21 DIAGNOSIS — Z823 Family history of stroke: Secondary | ICD-10-CM | POA: Insufficient documentation

## 2016-02-21 DIAGNOSIS — K2970 Gastritis, unspecified, without bleeding: Secondary | ICD-10-CM | POA: Insufficient documentation

## 2016-02-21 DIAGNOSIS — K210 Gastro-esophageal reflux disease with esophagitis: Secondary | ICD-10-CM | POA: Insufficient documentation

## 2016-02-21 HISTORY — PX: ESOPHAGOGASTRODUODENOSCOPY: SHX5428

## 2016-02-21 HISTORY — PX: SAVORY DILATION: SHX5439

## 2016-02-21 HISTORY — PX: COLONOSCOPY: SHX5424

## 2016-02-21 SURGERY — COLONOSCOPY
Anesthesia: Moderate Sedation

## 2016-02-21 MED ORDER — LIDOCAINE VISCOUS 2 % MT SOLN
OROMUCOSAL | Status: DC | PRN
  Administered 2016-02-21: 4 mL via OROMUCOSAL

## 2016-02-21 MED ORDER — LIDOCAINE VISCOUS 2 % MT SOLN
OROMUCOSAL | Status: DC
Start: 2016-02-21 — End: 2016-02-21
  Filled 2016-02-21: qty 15

## 2016-02-21 MED ORDER — MEPERIDINE HCL 100 MG/ML IJ SOLN
INTRAMUSCULAR | Status: AC
  Filled 2016-02-21: qty 2

## 2016-02-21 MED ORDER — MEPERIDINE HCL 100 MG/ML IJ SOLN
INTRAMUSCULAR | Status: DC | PRN
  Administered 2016-02-21 (×2): 25 mg via INTRAVENOUS
  Administered 2016-02-21: 50 mg via INTRAVENOUS

## 2016-02-21 MED ORDER — MINERAL OIL PO OIL
TOPICAL_OIL | ORAL | Status: AC
  Filled 2016-02-21: qty 30

## 2016-02-21 MED ORDER — SODIUM CHLORIDE 0.9 % IV SOLN
INTRAVENOUS | Status: DC
  Administered 2016-02-21: 1000 mL via INTRAVENOUS

## 2016-02-21 MED ORDER — MIDAZOLAM HCL 5 MG/5ML IJ SOLN
INTRAMUSCULAR | Status: DC | PRN
  Administered 2016-02-21: 1 mg via INTRAVENOUS
  Administered 2016-02-21 (×3): 2 mg via INTRAVENOUS

## 2016-02-21 MED ORDER — STERILE WATER FOR IRRIGATION IR SOLN
Status: DC | PRN
  Administered 2016-02-21: 2.5 mL

## 2016-02-21 MED ORDER — OMEPRAZOLE 20 MG PO CPDR: DELAYED_RELEASE_CAPSULE | ORAL | 11 refills | Status: DC

## 2016-02-21 MED ORDER — MIDAZOLAM HCL 5 MG/5ML IJ SOLN
INTRAMUSCULAR | Status: AC
  Filled 2016-02-21: qty 10

## 2016-02-21 NOTE — H&P (Signed)
  Primary Care Physician:  Soyla Dryer, PA-C Primary Gastroenterologist:  Dr. Oneida Alar  Pre-Procedure History & Physical: HPI:  Brenda Olsen is a 55 y.o. female here for  PERSONAL HISTORY OF POLYPS: 3 simple adenomas in 2012/DYSPHAGIA.  Past Medical History:  Diagnosis Date  . Asthma   . Cancer [facility]) 2011   breast  . Hyperlipidemia     Past Surgical History:  Procedure Laterality Date  . BREAST SURGERY Left 2012   lumpectomy- followed by radiation x 6 wk  . COLONOSCOPY  age 75  . KNEE ARTHROSCOPY Bilateral 2001, 2003  . LITHOTRIPSY  2011  . THIGH / KNEE SOFT TISSUE BIOPSY Right 55 yo   benign tumor removed L thigh  . TONSILLECTOMY  childhood  . VARICOSE VEIN SURGERY Right 2000    Prior to Admission medications   Medication Sig Start Date End Date Taking? Authorizing Provider  albuterol (PROVENTIL HFA;VENTOLIN HFA) 108 (90 Base) MCG/ACT inhaler Inhale 2 puffs into the lungs every 6 (six) hours as needed for wheezing or shortness of breath. 11/07/15  Yes Soyla Dryer, PA-C  Fluticasone-Salmeterol (ADVAIR) 250-50 MCG/DOSE AEPB Inhale 1 puff into the lungs 2 (two) times daily. 09/02/15  Yes Soyla Dryer, PA-C  Ibuprofen 200 MG CAPS Take 400 mg by mouth 2 (two) times daily as needed.   Yes Historical Provider, MD  lovastatin (MEVACOR) 20 MG tablet TAKE ONE TABLET BY MOUTH ONCE DAILY AT BEDTIME FOR CHOLESTEROL 09/23/15   Soyla Dryer, PA-C  omeprazole (PRILOSEC) 20 MG capsule Take 1 capsule (20 mg total) by mouth daily. 02/11/16   Carlis Stable, NP    Allergies as of 02/17/2016 - Review Complete 02/11/2016  Allergen Reaction Noted  . Sulfa antibiotics Hives 08/13/2015    Family History  Problem Relation Age of Onset  . Scleroderma Mother   . Lupus Mother   . Heart disease Mother   . Alzheimer's disease Father   . Heart disease Brother   . Stroke Brother   . Cancer Brother     lung  . Colon cancer Neg Hx     Social History   Social History  . Marital status:  Single    Spouse name: N/A  . Number of children: N/A  . Years of education: N/A   Occupational History  . Not on file.   Social History Main Topics  . Smoking status: Never Smoker  . Smokeless tobacco: Never Used  . Alcohol use No  . Drug use: No  . Sexual activity: Not on file   Other Topics Concern  . Not on file   Social History Narrative  . No narrative on file    Review of Systems: See HPI, otherwise negative ROS   Physical Exam: BP 117/77   Pulse 69   Temp 98.3 F (36.8 C) (Oral)   Resp 13   Ht 5\' 2"  (1.575 m)   Wt 125 lb (56.7 kg)   SpO2 99%   BMI 22.86 kg/m  General:   Alert,  pleasant and cooperative in NAD Head:  Normocephalic and atraumatic. Neck:  Supple; Lungs:  Clear throughout to auscultation.    Heart:  Regular rate and rhythm. Abdomen:  Soft, nontender and nondistended. Normal bowel sounds, without guarding, and without rebound.   Neurologic:  Alert and  oriented x4;  grossly normal neurologically.  Impression/Plan:     DYSPHAGIA/screening  PLAN:  EGD/DIL/tcs TODAY

## 2016-02-21 NOTE — Op Note (Signed)
[facility] Patient Name: Brenda Olsen Procedure Date: 02/21/2016 9:58 AM MRN: [id number] Date of Birth: [birth year] Attending MD: Barney Drain , MD CSN: [id number] Age: 55 Admit Type: Outpatient Procedure:                Upper GI endoscopy WITH ESOPHAGEAL DILATION/COLD                            FORCEPS BIOPSY Indications:              Dysphagia/DYSPEPSIA Providers:                Barney Drain, MD, Rosina Lowenstein, RN, Randa Spike,                            Technician Referring MD:             Soyla Dryer, PA-c Medicines:                TCS + Midazolam 2 mg IV Complications:            No immediate complications. Estimated Blood Loss:     Estimated blood loss was minimal. Procedure:                Pre-Anesthesia Assessment:                           - Prior to the procedure, a History and Physical                            was performed, and patient medications and                            allergies were reviewed. The patient's tolerance of                            previous anesthesia was also reviewed. The risks                            and benefits of the procedure and the sedation                            options and risks were discussed with the patient.                            All questions were answered, and informed consent                            was obtained. Prior Anticoagulants: The patient has                            taken ibuprofen, last dose was 1 week prior to                            procedure. ASA Grade Assessment: II - A patient  with mild systemic disease. After reviewing the                            risks and benefits, the patient was deemed in                            satisfactory condition to undergo the procedure.                            After obtaining informed consent, the endoscope was                            passed under direct vision. Throughout the                            procedure, the  patient's blood pressure, pulse, and                            oxygen saturations were monitored continuously. The                            [id number]) scope was introduced through the                            mouth, and advanced to the second part of duodenum.                            The upper GI endoscopy was accomplished without                            difficulty. The patient tolerated the procedure                            well. Scope In: 10:02:50 AM Scope Out: 10:11:23 AM Total Procedure Duration: 0 hours 8 minutes 33 seconds  Findings:      One moderate benign-appearing, intrinsic stenosis was found. This       measured 1.2 cm (inner diameter) and was traversed. A guidewire was       placed and the scope was withdrawn. Dilation was performed with a Savary       dilator with mild resistance at 12.8 mm, 14 mm, 15 mm and 16 mm.      Scattered mild inflammation characterized by congestion (edema) and       erythema was found in the stomach. Biopsies were taken with a cold       forceps for Helicobacter pylori testing.      A small hiatal hernia was present.      Scattered mild inflammation characterized by congestion (edema) and       erythema was found in the duodenal bulb.      The second portion of the duodenum was normal. 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 Moderate Sedation:      Moderate (conscious) sedation was administered by the endoscopy nurse       and supervised by the endoscopist. The following parameters were       monitored: oxygen saturation, heart rate, blood pressure, and response       to care. Total physician intraservice time was 47 minutes. Recommendation:           - Await pathology results.                           - High fiber diet and low  fat diet.                           - Use Prilosec (omeprazole) 20 mg PO BID.                           - Return to my office in 3 months.                           - Repeat colonoscopy in 5 years for surveillance.                           DRINK WATER TO KEEP YOUR URINE LIGHT YELLOW.                           FOLLOW A HIGH FIBER/LOW FAT DIET. AVOID ITEMS THAT                            CAUSE BLOATING.                           - Patient has a contact number available for                            emergencies. The signs and symptoms of potential                            delayed complications were discussed with the                            patient. Return to normal activities tomorrow.                            Written discharge instructions were provided to the                            patient.                           - Post procedure  medication instructions were                            provided to the patient. Procedure Code(s):        --- Professional ---                           [phone], Esophagogastroduodenoscopy, flexible,                            transoral; with insertion of guide wire followed by                            passage of dilator(s) through esophagus over guide                            wire                           [id number], Esophagogastroduodenoscopy, flexible,                            transoral; with biopsy, single or multiple                           [id number], Moderate sedation services provided by the                            same physician or other qualified health care                            professional performing the diagnostic or                            therapeutic service that the sedation supports,                            requiring the presence of an independent trained                            observer to assist in the monitoring of the                            patient's level of consciousness and physiological                             status; initial 15 minutes of intraservice time,                            patient age 64 years or older                           [phone], Moderate sedation services; each additional                            15 minutes intraservice time  [id number], Moderate sedation services; each additional                            15 minutes intraservice time Diagnosis Code(s):        --- Professional ---                           K22.2, Esophageal obstruction                           K29.70, Gastritis, unspecified, without bleeding                           K44.9, Diaphragmatic hernia without obstruction or                            gangrene                           R13.10, Dysphagia, unspecified CPT copyright 2016 [employer]. All rights reserved. The codes documented in this report are preliminary and upon coder review may  be revised to meet current compliance requirements. Barney Drain, MD Barney Drain, MD 02/21/2016 12:50:59 PM This report has been signed electronically. Number of Addenda: 0

## 2016-02-21 NOTE — Discharge Instructions (Signed)
YOU DID NOT HAVE ANY POLYPS. YOU HAVE DIVERTICULOSIS IN YOUR LEFT COLON. 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  YOU DID NOT HAVE ANY POLYPS. YOUR PROBLEM SWALLOWING IS DUE TO ACID REFLUX, and THE STRICTURE, WHICH IS ALSO DUE TO ACID REFLUX. YOU HAVE MILD CASE OF GASTRITIS due to ibuprofen. YOU HAVE A small HIATAL HERNIA. I BIOPSIED YOUR STOMACH.     DRINK WATER TO KEEP YOUR URINE LIGHT YELLOW.  FOLLOW A HIGH FIBER/LOW FAT DIET. AVOID ITEMS THAT CAUSE BLOATING. SEE INFO BELOW.  CONTINUE OMEPRAZOLE.  TAKE 30 MINUTES PRIOR TO YOUR MEALS TWICE DAILY.  YOUR BIOPSY RESULTS WILL BE AVAILABLE IN MY CHART AFTER AUG 8 AND MY OFFICE WILL CONTACT YOU IN 10-14 DAYS WITH YOUR RESULTS.   Follow up in NOV 2017.  Next colonoscopy in 5 years.    ENDOSCOPY Care After Read the instructions outlined below and refer to this sheet in the next week. These discharge instructions provide you with general information on caring for yourself after you leave the hospital. While your treatment has been planned according to the most current medical practices available, unavoidable complications occasionally occur. If you have any problems or questions after discharge, call DR. Laveta Gilkey, [phone].  ACTIVITY  You may resume your regular activity, but move at a slower pace for the next 24 hours.   Take frequent rest periods for the next 24 hours.   Walking will help get rid of the air and reduce the bloated feeling in your belly (abdomen).   No driving for 24 hours (because of the medicine (anesthesia) used during the test).   You may shower.   Do not sign any important legal documents or operate any machinery for 24 hours (because of the anesthesia used during the test).    NUTRITION  Drink plenty of fluids.   You may resume your normal diet as instructed by your doctor.   Begin with a light meal and progress to your normal diet. Heavy or fried foods are harder to digest and may make you  feel sick to your stomach (nauseated).   Avoid alcoholic beverages for 24 hours or as instructed.    MEDICATIONS  You may resume your normal medications.   WHAT YOU CAN EXPECT TODAY  Some feelings of bloating in the abdomen.   Passage of more gas than usual.   Spotting of blood in your stool or on the toilet paper  .  IF YOU HAD POLYPS REMOVED DURING THE ENDOSCOPY:  Eat a soft diet IF YOU HAVE NAUSEA, BLOATING, ABDOMINAL PAIN, OR VOMITING.    FINDING OUT THE RESULTS OF YOUR TEST Not all test results are available during your visit. DR. Oneida Alar WILL CALL YOU WITHIN 14 DAYS OF YOUR PROCEDUE WITH YOUR RESULTS. Do not assume everything is normal if you have not heard from DR. Aashika Carta, CALL HER OFFICE AT [phone].  SEEK IMMEDIATE MEDICAL ATTENTION AND CALL THE OFFICE: [phone] IF:  You have more than a spotting of blood in your stool.   Your belly is swollen (abdominal distention).   You are nauseated or vomiting.   You have a temperature over 101F.   You have abdominal pain or discomfort that is severe or gets worse throughout the day.   Low-Fat Diet BREADS, CEREALS, PASTA, RICE, DRIED PEAS, AND BEANS These products are high in carbohydrates and most are low in fat. Therefore, they can be increased in the diet as substitutes for fatty foods. They too, however, contain calories  and should not be eaten in excess. Cereals can be eaten for snacks as well as for breakfast.   FRUITS AND VEGETABLES It is good to eat fruits and vegetables. Besides being sources of fiber, both are rich in vitamins and some minerals. They help you get the daily allowances of these nutrients. Fruits and vegetables can be used for snacks and desserts.  MEATS Limit lean meat, chicken, [place], and fish to no more than 6 ounces per day. Beef, Pork, and Lamb Use lean cuts of beef, pork, and lamb. Lean cuts include:  Extra-lean ground beef.  Arm roast.  Sirloin tip.  Center-cut ham.  Round  steak.  Loin chops.  Rump roast.  Tenderloin.  Trim all fat off the outside of meats before cooking. It is not necessary to severely decrease the intake of red meat, but lean choices should be made. Lean meat is rich in protein and contains a highly absorbable form of iron. Premenopausal women, in particular, should avoid reducing lean red meat because this could increase the risk for low red blood cells (iron-deficiency anemia).  Chicken and [place] These are good sources of protein. The fat of poultry can be reduced by removing the skin and underlying fat layers before cooking. Chicken and [place] can be substituted for lean red meat in the diet. Poultry should not be fried or covered with high-fat sauces. Fish and Shellfish Fish is a good source of protein. Shellfish contain cholesterol, but they usually are low in saturated fatty acids. The preparation of fish is important. Like chicken and [place], they should not be fried or covered with high-fat sauces. EGGS Egg whites contain no fat or cholesterol. They can be eaten often. Try 1 to 2 egg whites instead of whole eggs in recipes or use egg substitutes that do not contain yolk. MILK AND DAIRY PRODUCTS Use skim or 1% milk instead of 2% or whole milk. Decrease whole milk, natural, and processed cheeses. Use nonfat or low-fat (2%) cottage cheese or low-fat cheeses made from vegetable oils. Choose nonfat or low-fat (1 to 2%) yogurt. Experiment with evaporated skim milk in recipes that call for heavy cream. Substitute low-fat yogurt or low-fat cottage cheese for sour cream in dips and salad dressings. Have at least 2 servings of low-fat dairy products, such as 2 glasses of skim (or 1%) milk each day to help get your daily calcium intake. FATS AND OILS Reduce the total intake of fats, especially saturated fat. Butterfat, lard, and beef fats are high in saturated fat and cholesterol. These should be avoided as much as possible. Vegetable fats do not  contain cholesterol, but certain vegetable fats, such as coconut oil, palm oil, and palm kernel oil are very high in saturated fats. These should be limited. These fats are often used in bakery goods, processed foods, popcorn, oils, and nondairy creamers. Vegetable shortenings and some peanut butters contain hydrogenated oils, which are also saturated fats. Read the labels on these foods and check for saturated vegetable oils. Unsaturated vegetable oils and fats do not raise blood cholesterol. However, they should be limited because they are fats and are high in calories. Total fat should still be limited to 30% of your daily caloric intake. Desirable liquid vegetable oils are corn oil, cottonseed oil, olive oil, canola oil, safflower oil, soybean oil, and sunflower oil. Peanut oil is not as good, but small amounts are acceptable. Buy a heart-healthy tub margarine that has no partially hydrogenated oils in the ingredients. Mayonnaise and salad  dressings often are made from unsaturated fats, but they should also be limited because of their high calorie and fat content. Seeds, nuts, peanut butter, olives, and avocados are high in fat, but the fat is mainly the unsaturated type. These foods should be limited mainly to avoid excess calories and fat. OTHER EATING TIPS Snacks  Most sweets should be limited as snacks. They tend to be rich in calories and fats, and their caloric content outweighs their nutritional value. Some good choices in snacks are graham crackers, melba toast, soda crackers, bagels (no egg), English muffins, fruits, and vegetables. These snacks are preferable to snack crackers, [place] fries, TORTILLA CHIPS, and POTATO chips. Popcorn should be air-popped or cooked in small amounts of liquid vegetable oil. Desserts Eat fruit, low-fat yogurt, and fruit ices instead of pastries, cake, and cookies. Sherbet, angel food cake, gelatin dessert, frozen low-fat yogurt, or other frozen products that do not  contain saturated fat (pure fruit juice bars, frozen ice pops) are also acceptable.  COOKING METHODS Choose those methods that use little or no fat. They include: Poaching.  Braising.  Steaming.  Grilling.  Baking.  Stir-frying.  Broiling.  Microwaving.  Foods can be cooked in a nonstick pan without added fat, or use a nonfat cooking spray in regular cookware. Limit fried foods and avoid frying in saturated fat. Add moisture to lean meats by using water, broth, cooking wines, and other nonfat or low-fat sauces along with the cooking methods mentioned above. Soups and stews should be chilled after cooking. The fat that forms on top after a few hours in the refrigerator should be skimmed off. When preparing meals, avoid using excess salt. Salt can contribute to raising blood pressure in some people.  EATING AWAY FROM HOME Order entres, potatoes, and vegetables without sauces or butter. When meat exceeds the size of a deck of cards (3 to 4 ounces), the rest can be taken home for another meal. Choose vegetable or fruit salads and ask for low-calorie salad dressings to be served on the side. Use dressings sparingly. Limit high-fat toppings, such as bacon, crumbled eggs, cheese, sunflower seeds, and olives. Ask for heart-healthy tub margarine instead of butter.  High-Fiber Diet A high-fiber diet changes your normal diet to include more whole grains, legumes, fruits, and vegetables. Changes in the diet involve replacing refined carbohydrates with unrefined foods. The calorie level of the diet is essentially unchanged. The Dietary Reference Intake (recommended amount) for adult males is 38 grams per day. For adult females, it is 25 grams per day. Pregnant and lactating women should consume 28 grams of fiber per day. Fiber is the intact part of a plant that is not broken down during digestion. Functional fiber is fiber that has been isolated from the plant to provide a beneficial effect in the  body. PURPOSE  Increase stool bulk.   Ease and regulate bowel movements.   Lower cholesterol.   REDUCE RISK OF COLON CANCER  INDICATIONS THAT YOU NEED MORE FIBER  Constipation and hemorrhoids.   Uncomplicated diverticulosis (intestine condition) and irritable bowel syndrome.   Weight management.   As a protective measure against hardening of the arteries (atherosclerosis), diabetes, and cancer.   GUIDELINES FOR INCREASING FIBER IN THE DIET  Start adding fiber to the diet slowly. A gradual increase of about 5 more grams (2 slices of whole-wheat bread, 2 servings of most fruits or vegetables, or 1 bowl of high-fiber cereal) per day is best. Too rapid an increase in fiber  may result in constipation, flatulence, and bloating.   Drink enough water and fluids to keep your urine clear or pale yellow. Water, juice, or caffeine-free drinks are recommended. Not drinking enough fluid may cause constipation.   Eat a variety of high-fiber foods rather than one type of fiber.   Try to increase your intake of fiber through using high-fiber foods rather than fiber pills or supplements that contain small amounts of fiber.   The goal is to change the types of food eaten. Do not supplement your present diet with high-fiber foods, but replace foods in your present diet.   INCLUDE A VARIETY OF FIBER SOURCES  Replace refined and processed grains with whole grains, canned fruits with fresh fruits, and incorporate other fiber sources. White rice, white breads, and most bakery goods contain little or no fiber.   Brown whole-grain rice, buckwheat oats, and many fruits and vegetables are all good sources of fiber. These include: broccoli, Brussels sprouts, cabbage, cauliflower, beets, sweet potatoes, white potatoes (skin on), carrots, tomatoes, eggplant, squash, berries, fresh fruits, and dried fruits.   Cereals appear to be the richest source of fiber. Cereal fiber is found in whole grains and bran.  Bran is the fiber-rich outer coat of cereal grain, which is largely removed in refining. In whole-grain cereals, the bran remains. In breakfast cereals, the largest amount of fiber is found in those with "bran" in their names. The fiber content is sometimes indicated on the label.   You may need to include additional fruits and vegetables each day.   In baking, for 1 cup white flour, you may use the following substitutions:   1 cup whole-wheat flour minus 2 tablespoons.   1/2 cup white flour plus 1/2 cup whole-wheat flour.   Hiatal Hernia A hiatal hernia occurs when a part of the stomach slides above the diaphragm. The diaphragm is the thin muscle separating the belly (abdomen) from the chest. A hiatal hernia can be something you are born with or develop over time. Hiatal hernias may allow stomach acid to flow back into your esophagus, the tube which carries food from your mouth to your stomach. If this acid causes problems it is called GERD (gastro-esophageal reflux disease).   SYMPTOMS Common symptoms of GERD are heartburn (burning in your chest). This is worse when lying down or bending over. It may also cause belching and indigestion. Some of the things which make GERD worse are:  Increased weight pushes on stomach making acid rise more easily.   Smoking markedly increases acid production.   Alcohol decreases lower esophageal sphincter pressure (valve between stomach and esophagus), allowing acid from stomach into esophagus.   Late evening meals and going to bed with a full stomach increases pressure.   HOME CARE INSTRUCTIONS  Try to achieve and maintain an ideal body weight.   Avoid drinking alcoholic beverages.   Stop smoking.   Put the head of your bed on 4 to 6 inch blocks. This will keep your head and esophagus higher than your stomach. If you cannot use blocks, sleep with several pillows under your head and shoulders.   MINIMIZE THE USE OF aspirin, ibuprofen (Advil or  Motrin), or other nonsteroidal anti-inflammatory drugs.   Do not wear tight clothing around your chest or stomach.   Eat smaller meals and eat more frequently. This keeps your stomach from getting too full. Eat slowly.   Do not lie down for 2 or 3 hours after eating. Do not eat or  drink anything 1 to 2 hours before going to bed.   Avoid caffeine beverages (colas, coffee, cocoa, tea), fatty foods, citrus fruits and all other foods and drinks that contain acid and that seem to increase the problems.   Avoid bending over, especially after eating. Also avoid straining during bowel movements or when urinating or lifting things. Anything that increases the pressure in your belly increases the amount of acid that may be pushed up into your esophagus.   Gastritis  Gastritis is an inflammation (the body's way of reacting to injury and/or infection) of the stomach. It is often caused by viral or bacterial (germ) infections. It can also be caused BY ASPIRIN, BC/GOODY POWDER'S, (IBUPROFEN) MOTRIN, OR ALEVE (NAPROXEN), chemicals (including alcohol), SPICY FOODS, and medications. This illness may be associated with generalized malaise (feeling tired, not well), UPPER ABDOMINAL STOMACH cramps, and fever. One common bacterial cause of gastritis is an organism known as H. Pylori. This can be treated with antibiotics.

## 2016-02-21 NOTE — Op Note (Signed)
[facility] Patient Name: Brenda Olsen Procedure Date: 02/21/2016 9:24 AM MRN: [id number] Date of Birth: [birth year] Attending MD: Barney Drain , MD CSN: [id number] Age: 55 Admit Type: Outpatient Procedure:                Colonoscopy, SCREENING Indications:              High risk colon cancer surveillance: Personal                            history of colonic polyps: 2 SIMPLE ADENOMAS 2012 Providers:                Barney Drain, MD, Rosina Lowenstein, RN, Randa Spike,                            Technician Referring MD:             Soyla Dryer, PA-c Medicines:                Meperidine 100 mg IV, Midazolam 5 mg IV Complications:            No immediate complications. Estimated Blood Loss:     Estimated blood loss: none. Procedure:                Pre-Anesthesia Assessment:                           - Prior to the procedure, a History and Physical                            was performed, and patient medications and                            allergies were reviewed. The patient's tolerance of                            previous anesthesia was also reviewed. The risks                            and benefits of the procedure and the sedation                            options and risks were discussed with the patient.                            All questions were answered, and informed consent                            was obtained. Prior Anticoagulants: The patient has                            taken ibuprofen, last dose was 1 week prior to                            procedure. ASA Grade Assessment: II - A patient  with mild systemic disease. After reviewing the                            risks and benefits, the patient was deemed in                            satisfactory condition to undergo the procedure.                            After obtaining informed consent, the colonoscope                            was passed under direct vision. Throughout  the                            procedure, the patient's blood pressure, pulse, and                            oxygen saturations were monitored continuously. The                            Colonoscope was introduced through the anus and                            advanced to the the cecum, identified by                            appendiceal orifice and ileocecal valve. The                            ileocecal valve, appendiceal orifice, and rectum                            were photographed. The colonoscopy was somewhat                            difficult due to a tortuous colon. Successful                            completion of the procedure was aided by COLOWRAP.                            The patient tolerated the procedure well. The                            quality of the bowel preparation was excellent. Scope In: 9:39:54 AM Scope Out: 9:55:46 AM Scope Withdrawal Time: 0 hours 10 minutes 53 seconds  Total Procedure Duration: 0 hours 15 minutes 52 seconds  Findings:      The recto-sigmoid colon, sigmoid colon and descending colon were mildly       redundant. AND COLOWRAP      A few small-mouthed diverticula were found in the sigmoid colon.      Non-bleeding internal hemorrhoids were found. The hemorrhoids were small. Impression:               -  Redundant colon.                           - Diverticulosis in the sigmoid colon.                           - Non-bleeding internal hemorrhoids. Moderate Sedation:      Moderate (conscious) sedation was administered by the endoscopy nurse       and supervised by the endoscopist. The following parameters were       monitored: oxygen saturation, heart rate, blood pressure, and response       to care. Total physician intraservice time was 47 minutes. Recommendation:           - High fiber diet.                           - Continue present medications.                           - Await pathology results.                           - Repeat  colonoscopy in 5 years for surveillance.                           DRINK WATER TO KEEP YOUR URINE LIGHT YELLOW.                           FOLLOW A HIGH FIBER/LOW FAT DIET. AVOID ITEMS THAT                            CAUSE BLOATING. SEE INFO BELOW.                           CONTINUE OMEPRAZOLE. TAKE 30 MINUTES PRIOR TO MEALS                            TWICE DAILY.                           Follow up in NOV 2017.                           - Patient has a contact number available for                            emergencies. The signs and symptoms of potential                            delayed complications were discussed with the                            patient. Return to normal activities tomorrow.                            Written discharge instructions were provided to the  patient. Procedure Code(s):        --- Professional ---                           [phone], Colonoscopy, flexible; diagnostic, including                            collection of specimen(s) by brushing or washing,                            when performed (separate procedure)                           99152, Moderate sedation services provided by the                            same physician or other qualified health care                            professional performing the diagnostic or                            therapeutic service that the sedation supports,                            requiring the presence of an independent trained                            observer to assist in the monitoring of the                            patient's level of consciousness and physiological                            status; initial 15 minutes of intraservice time,                            patient age 34 years or older                           [phone], Moderate sedation services; each additional                            15 minutes intraservice time                           99153, Moderate  sedation services; each additional                            15 minutes intraservice time Diagnosis Code(s):        --- Professional ---                           Z86.010, Personal history of colonic polyps  K64.8, Other hemorrhoids                           K57.30, Diverticulosis of large intestine without                            perforation or abscess without bleeding                           Q43.8, Other specified congenital malformations of                            intestine CPT copyright 2016 [employer]. All rights reserved. The codes documented in this report are preliminary and upon coder review may  be revised to meet current compliance requirements. Barney Drain, MD Barney Drain, MD 02/21/2016 12:44:27 PM This report has been signed electronically. Number of Addenda: 0

## 2016-02-27 ENCOUNTER — Telehealth: Payer: Self-pay | Admitting: Gastroenterology

## 2016-02-27 NOTE — Telephone Encounter (Signed)
HER stomach Bx shows gastritis DUE TO IBUPROFEN. HER PROBLEM SWALLOWING IS DUE TO ACID REFLUX, and THE STRICTURE, WHICH IS ALSO DUE TO ACID REFLUX.      DRINK WATER TO KEEP YOUR URINE LIGHT YELLOW.  FOLLOW A HIGH FIBER/LOW FAT DIET. AVOID ITEMS THAT CAUSE BLOATING.   CONTINUE OMEPRAZOLE.  TAKE 30 MINUTES PRIOR TO YOUR MEALS TWICE DAILY FOR 3 MOS THEN ONCE DAILY FOREVER.  Follow up in NOV 2017 E30 DYSPHAGIA/GGERD.  Next colonoscopy in 5 years.

## 2016-02-27 NOTE — Telephone Encounter (Signed)
OV made and reminder in epic °

## 2016-02-28 ENCOUNTER — Encounter (HOSPITAL_COMMUNITY): Payer: Self-pay | Admitting: Gastroenterology

## 2016-03-10 ENCOUNTER — Ambulatory Visit: Payer: Self-pay | Admitting: Physician Assistant

## 2016-03-10 ENCOUNTER — Encounter: Payer: Self-pay | Admitting: Physician Assistant

## 2016-03-10 VITALS — BP 102/60 | HR 75 | Temp 98.1°F | Ht 62.0 in | Wt 133.6 lb

## 2016-03-10 DIAGNOSIS — B029 Zoster without complications: Secondary | ICD-10-CM

## 2016-03-10 MED ORDER — VALACYCLOVIR HCL 1 G PO TABS: 1000.0000 mg | ORAL_TABLET | Freq: Three times a day (TID) | ORAL | 0 refills | Status: DC

## 2016-03-10 NOTE — Patient Instructions (Signed)

## 2016-03-10 NOTE — Progress Notes (Signed)
BP 102/60 (BP Location: Left Arm, Patient Position: Sitting, Cuff Size: Normal)   Pulse 75   Temp 98.1 F (36.7 C)   Ht 5\' 2"  (1.575 m)   Wt 133 lb 9.6 oz (60.6 kg)   SpO2 98%   BMI 24.44 kg/m    Subjective:    Patient ID: Brenda Olsen, female    DOB: 1961/03/08, 55 y.o.   MRN: [id number]  HPI: Brenda Olsen is a 55 y.o. female presenting on 03/10/2016 for Herpes Zoster   HPI   Started with pain and rash on Thursday. No hx shingles   Relevant past medical, surgical, family and social history reviewed and updated as indicated. Interim medical history since our last visit reviewed. Allergies and medications reviewed and updated.   Current Outpatient Prescriptions:  .  albuterol (PROVENTIL HFA;VENTOLIN HFA) 108 (90 Base) MCG/ACT inhaler, Inhale 2 puffs into the lungs every 6 (six) hours as needed for wheezing or shortness of breath., Disp: 3 Inhaler, Rfl: 3 .  Fluticasone-Salmeterol (ADVAIR) 250-50 MCG/DOSE AEPB, Inhale 1 puff into the lungs 2 (two) times daily., Disp: 180 each, Rfl: 3 .  Ibuprofen 200 MG CAPS, Take 400 mg by mouth 2 (two) times daily as needed., Disp: , Rfl:  .  lovastatin (MEVACOR) 20 MG tablet, TAKE ONE TABLET BY MOUTH ONCE DAILY AT BEDTIME FOR CHOLESTEROL, Disp: 30 tablet, Rfl: 6 .  omeprazole (PRILOSEC) 20 MG capsule, 1 po 30 mins prior to meals bid for 3 mos then once a week, Disp: 60 capsule, Rfl: 11  Review of Systems  Constitutional: Positive for fatigue. Negative for appetite change, chills, diaphoresis, fever and unexpected weight change.  HENT: Positive for congestion and sneezing. Negative for drooling, ear pain, facial swelling, hearing loss, mouth sores, sore throat, trouble swallowing and voice change.   Eyes: Negative for pain, discharge, redness, itching and visual disturbance.  Respiratory: Negative for cough, choking, shortness of breath and wheezing.   Cardiovascular: Negative for chest pain, palpitations and leg swelling.  Gastrointestinal:  Negative for abdominal pain, blood in stool, constipation, diarrhea and vomiting.  Endocrine: Negative for cold intolerance, heat intolerance and polydipsia.  Genitourinary: Negative for decreased urine volume, dysuria and hematuria.  Musculoskeletal: Positive for arthralgias and gait problem. Negative for back pain.  Skin: Positive for rash.  Allergic/Immunologic: Positive for environmental allergies.  Neurological: Negative for seizures, syncope, light-headedness and headaches.  Hematological: Negative for adenopathy.  Psychiatric/Behavioral: Negative for agitation, dysphoric mood and suicidal ideas. The patient is not nervous/anxious.     Per HPI unless specifically indicated above     Objective:    BP 102/60 (BP Location: Left Arm, Patient Position: Sitting, Cuff Size: Normal)   Pulse 75   Temp 98.1 F (36.7 C)   Ht 5\' 2"  (1.575 m)   Wt 133 lb 9.6 oz (60.6 kg)   SpO2 98%   BMI 24.44 kg/m   Wt Readings from Last 3 Encounters:  03/10/16 133 lb 9.6 oz (60.6 kg)  02/21/16 125 lb (56.7 kg)  02/11/16 135 lb (61.2 kg)    Physical Exam  Constitutional: She is oriented to person, place, and time. She appears well-developed and well-nourished.  Pulmonary/Chest: Effort normal.  Neurological: She is alert and oriented to person, place, and time.  Skin: Skin is warm and dry. Rash noted.  Group of vesicles, many becoming scabbed over, on R thigh with some lesions extending distally in dermatomal fashion  Psychiatric: She has a normal mood and affect. Her behavior is normal.  Thought content normal.  Nursing note and vitals reviewed.          Assessment & Plan:    Encounter Diagnosis  Name Primary?  . Herpes zoster Yes    -rx valtrex -gave reading information on shingles -F/u as scheduled. RTO sooner prn

## 2016-04-01 ENCOUNTER — Other Ambulatory Visit: Payer: Self-pay | Admitting: Student

## 2016-04-01 DIAGNOSIS — E785 Hyperlipidemia, unspecified: Secondary | ICD-10-CM

## 2016-04-02 LAB — LIPID PANEL
CHOLESTEROL: 183 mg/dL (ref 125–200)
HDL: 77 mg/dL (ref >=46)
LDL Cholesterol: 91 mg/dL (ref <130)
TRIGLYCERIDES: 77 mg/dL (ref <150)
Total CHOL/HDL Ratio: 2.4 Ratio (ref <=5.0)
VLDL: 15 mg/dL (ref <30)

## 2016-04-02 LAB — COMPLETE METABOLIC PANEL WITH GFR
ALBUMIN: 3.7 g/dL (ref 3.6–5.1)
ALK PHOS: 66 U/L (ref 33–130)
ALT: 24 U/L (ref 6–29)
AST: 25 U/L (ref 10–35)
BILIRUBIN TOTAL: 0.7 mg/dL (ref 0.2–1.2)
BUN: 14 mg/dL (ref 7–25)
CALCIUM: 8.9 mg/dL (ref 8.6–10.4)
CO2: 26 mmol/L (ref 20–31)
CREATININE: 0.83 mg/dL (ref 0.50–1.05)
Chloride: 106 mmol/L (ref 98–110)
GFR, EST NON AFRICAN AMERICAN: 79 mL/min (ref >=60)
Glucose, Bld: 92 mg/dL (ref 65–99)
Potassium: 4.1 mmol/L (ref 3.5–5.3)
Sodium: 138 mmol/L (ref 135–146)
TOTAL PROTEIN: 6.2 g/dL (ref 6.1–8.1)

## 2016-04-09 ENCOUNTER — Ambulatory Visit: Payer: Self-pay | Admitting: Physician Assistant

## 2016-04-20 ENCOUNTER — Ambulatory Visit: Payer: Self-pay

## 2016-05-13 ENCOUNTER — Ambulatory Visit: Payer: Self-pay | Admitting: Nurse Practitioner

## 2016-05-18 ENCOUNTER — Encounter: Payer: Self-pay | Admitting: Physician Assistant

## 2016-05-19 ENCOUNTER — Ambulatory Visit: Payer: Self-pay | Admitting: Physician Assistant

## 2016-05-19 ENCOUNTER — Encounter: Payer: Self-pay | Admitting: Physician Assistant

## 2016-05-19 ENCOUNTER — Ambulatory Visit: Payer: Self-pay | Admitting: Nurse Practitioner

## 2016-05-19 VITALS — BP 116/64 | HR 88 | Temp 98.1°F | Ht 62.0 in | Wt 134.5 lb

## 2016-05-19 DIAGNOSIS — J45909 Unspecified asthma, uncomplicated: Secondary | ICD-10-CM

## 2016-05-19 DIAGNOSIS — K219 Gastro-esophageal reflux disease without esophagitis: Secondary | ICD-10-CM

## 2016-05-19 DIAGNOSIS — E785 Hyperlipidemia, unspecified: Secondary | ICD-10-CM

## 2016-05-19 NOTE — Progress Notes (Signed)
BP 116/64 (BP Location: Left Arm, Patient Position: Sitting, Cuff Size: Normal)   Pulse 88   Temp 98.1 F (36.7 C)   Ht 5\' 2"  (1.575 m)   Wt 134 lb 8 oz (61 kg)   SpO2 97%   BMI 24.60 kg/m    Subjective:    Patient ID: Brenda Olsen, female    DOB: 1960-10-24, 55 y.o.   MRN: [id number]  HPI: Brenda Olsen is a 55 y.o. female presenting on 05/19/2016 for Asthma and Hyperlipidemia   HPI   Pt swallowing much better.  She is only taking IBU when she really needs it.  She is now working at [employer].    Pt with some congestion and sinus but symtpoms getting better.    Relevant past medical, surgical, family and social history reviewed and updated as indicated. Interim medical history since our last visit reviewed. Allergies and medications reviewed and updated.   Current Outpatient Prescriptions:  .  albuterol (PROVENTIL HFA;VENTOLIN HFA) 108 (90 Base) MCG/ACT inhaler, Inhale 2 puffs into the lungs every 6 (six) hours as needed for wheezing or shortness of breath., Disp: 3 Inhaler, Rfl: 3 .  Fluticasone-Salmeterol (ADVAIR) 250-50 MCG/DOSE AEPB, Inhale 1 puff into the lungs 2 (two) times daily., Disp: 180 each, Rfl: 3 .  Ibuprofen 200 MG CAPS, Take 400 mg by mouth 2 (two) times daily as needed., Disp: , Rfl:  .  lovastatin (MEVACOR) 20 MG tablet, TAKE ONE TABLET BY MOUTH ONCE DAILY AT BEDTIME FOR CHOLESTEROL, Disp: 30 tablet, Rfl: 6 .  omeprazole (PRILOSEC) 20 MG capsule, 1 po 30 mins prior to meals bid for 3 mos then once a week, Disp: 60 capsule, Rfl: 11   Review of Systems  Constitutional: Positive for fatigue. Negative for appetite change, chills, diaphoresis, fever and unexpected weight change.  HENT: Positive for congestion and sneezing. Negative for dental problem, drooling, ear pain, facial swelling, hearing loss, mouth sores, sore throat, trouble swallowing and voice change.   Eyes: Negative for pain, discharge, redness, itching and visual disturbance.  Respiratory:  Positive for wheezing. Negative for cough, choking and shortness of breath.   Cardiovascular: Negative for chest pain, palpitations and leg swelling.  Gastrointestinal: Negative for abdominal pain, blood in stool, constipation, diarrhea and vomiting.  Endocrine: Negative for cold intolerance, heat intolerance and polydipsia.  Genitourinary: Negative for decreased urine volume, dysuria and hematuria.  Musculoskeletal: Positive for arthralgias. Negative for back pain and gait problem.  Skin: Negative for rash.  Allergic/Immunologic: Positive for environmental allergies.  Neurological: Negative for seizures, syncope, light-headedness and headaches.  Hematological: Negative for adenopathy.  Psychiatric/Behavioral: Negative for agitation, dysphoric mood and suicidal ideas. The patient is not nervous/anxious.     Per HPI unless specifically indicated above     Objective:    BP 116/64 (BP Location: Left Arm, Patient Position: Sitting, Cuff Size: Normal)   Pulse 88   Temp 98.1 F (36.7 C)   Ht 5\' 2"  (1.575 m)   Wt 134 lb 8 oz (61 kg)   SpO2 97%   BMI 24.60 kg/m   Wt Readings from Last 3 Encounters:  05/19/16 134 lb 8 oz (61 kg)  03/10/16 133 lb 9.6 oz (60.6 kg)  02/21/16 125 lb (56.7 kg)    Physical Exam  Constitutional: She is oriented to person, place, and time. She appears well-developed and well-nourished.  HENT:  Head: Normocephalic and atraumatic.  Neck: Neck supple.  Cardiovascular: Normal rate and regular rhythm.   Pulmonary/Chest: Effort  normal and breath sounds normal.  Abdominal: Soft. Bowel sounds are normal. She exhibits no mass. There is no hepatosplenomegaly. There is no tenderness.  Musculoskeletal: She exhibits no edema.  Lymphadenopathy:    She has no cervical adenopathy.  Neurological: She is alert and oriented to person, place, and time.  Skin: Skin is warm and dry.  Psychiatric: She has a normal mood and affect. Her behavior is normal.  Vitals  reviewed.   Results for orders placed or performed in visit on 04/01/16  Lipid Profile  Result Value Ref Range   Cholesterol 183 125 - 200 mg/dL   Triglycerides 77 <150 mg/dL   HDL 77 >=46 mg/dL   Total CHOL/HDL Ratio 2.4 <=5.0 Ratio   VLDL 15 <30 mg/dL   LDL Cholesterol 91 <130 mg/dL  COMPLETE METABOLIC PANEL WITH GFR  Result Value Ref Range   Sodium 138 135 - 146 mmol/L   Potassium 4.1 3.5 - 5.3 mmol/L   Chloride 106 98 - 110 mmol/L   CO2 26 20 - 31 mmol/L   Glucose, Bld 92 65 - 99 mg/dL   BUN 14 7 - 25 mg/dL   Creat 0.83 0.50 - 1.05 mg/dL   Total Bilirubin 0.7 0.2 - 1.2 mg/dL   Alkaline Phosphatase 66 33 - 130 U/L   AST 25 10 - 35 U/L   ALT 24 6 - 29 U/L   Total Protein 6.2 6.1 - 8.1 g/dL   Albumin 3.7 3.6 - 5.1 g/dL   Calcium 8.9 8.6 - 10.4 mg/dL   GFR, Est African American >89 >=60 mL/min   GFR, Est Non African American 79 >=60 mL/min      Assessment & Plan:   Encounter Diagnoses  Name Primary?  . Hyperlipidemia, unspecified hyperlipidemia type Yes  . Asthma, chronic, unspecified asthma severity, uncomplicated   . Gastroesophageal reflux disease, esophagitis presence not specified     -reviewed labs with pt -continue current medications -follow up 4 months with PAP at that appointment. RTO sooner prn

## 2016-07-21 ENCOUNTER — Other Ambulatory Visit: Payer: Self-pay | Admitting: Physician Assistant

## 2016-08-11 ENCOUNTER — Other Ambulatory Visit: Payer: Self-pay | Admitting: Physician Assistant

## 2016-08-11 MED ORDER — FLUTICASONE-SALMETEROL 250-50 MCG/DOSE IN AEPB: 1.0000 | INHALATION_SPRAY | Freq: Two times a day (BID) | RESPIRATORY_TRACT | 3 refills | Status: DC

## 2016-09-09 ENCOUNTER — Other Ambulatory Visit: Payer: Self-pay | Admitting: Student

## 2016-09-09 DIAGNOSIS — E785 Hyperlipidemia, unspecified: Secondary | ICD-10-CM

## 2016-09-10 LAB — LIPID PANEL
Cholesterol: 175 mg/dL (ref <200)
HDL: 72 mg/dL (ref >50)
LDL Cholesterol: 88 mg/dL (ref <100)
Total CHOL/HDL Ratio: 2.4 Ratio (ref <5.0)
Triglycerides: 77 mg/dL (ref <150)
VLDL: 15 mg/dL (ref <30)

## 2016-09-10 LAB — COMPREHENSIVE METABOLIC PANEL
ALBUMIN: 3.8 g/dL (ref 3.6–5.1)
ALK PHOS: 68 U/L (ref 33–130)
ALT: 29 U/L (ref 6–29)
AST: 31 U/L (ref 10–35)
BILIRUBIN TOTAL: 0.9 mg/dL (ref 0.2–1.2)
BUN: 13 mg/dL (ref 7–25)
CO2: 26 mmol/L (ref 20–31)
Calcium: 8.9 mg/dL (ref 8.6–10.4)
Chloride: 109 mmol/L (ref 98–110)
Creat: 0.83 mg/dL (ref 0.50–1.05)
Glucose, Bld: 89 mg/dL (ref 65–99)
Potassium: 4.3 mmol/L (ref 3.5–5.3)
SODIUM: 142 mmol/L (ref 135–146)
TOTAL PROTEIN: 6.2 g/dL (ref 6.1–8.1)

## 2016-09-15 ENCOUNTER — Encounter: Payer: Self-pay | Admitting: Physician Assistant

## 2016-09-15 ENCOUNTER — Ambulatory Visit: Payer: Self-pay | Admitting: Physician Assistant

## 2016-09-15 VITALS — BP 120/60 | HR 90 | Temp 99.0°F | Wt 138.0 lb

## 2016-09-15 DIAGNOSIS — K219 Gastro-esophageal reflux disease without esophagitis: Secondary | ICD-10-CM

## 2016-09-15 DIAGNOSIS — Z853 Personal history of malignant neoplasm of breast: Secondary | ICD-10-CM | POA: Insufficient documentation

## 2016-09-15 DIAGNOSIS — J45909 Unspecified asthma, uncomplicated: Secondary | ICD-10-CM

## 2016-09-15 DIAGNOSIS — Z01419 Encounter for gynecological examination (general) (routine) without abnormal findings: Secondary | ICD-10-CM

## 2016-09-15 DIAGNOSIS — E785 Hyperlipidemia, unspecified: Secondary | ICD-10-CM

## 2016-09-15 MED ORDER — OMEPRAZOLE 20 MG PO CPDR: 20.0000 mg | DELAYED_RELEASE_CAPSULE | Freq: Every day | ORAL | 1 refills | Status: DC | PRN

## 2016-09-15 NOTE — Progress Notes (Signed)
BP 120/60 (BP Location: Left Arm, Patient Position: Sitting, Cuff Size: Normal)   Pulse 90   Temp 99 F (37.2 C)   Wt 138 lb (62.6 kg)   LMP 07/20/2006   SpO2 98%   BMI 25.24 kg/m    Subjective:    Patient ID: Brenda Olsen, female    DOB: 1960/09/17, 56 y.o.   MRN: [id number]  HPI: Brenda Olsen is a 56 y.o. female presenting on 09/15/2016 for Hyperlipidemia; Asthma; and Gynecologic Exam   HPI   Pt not taking omeprazole.  Says cant' afford it.  She is having symptoms and would like to get back on it.   Pt states breathing good as long as she stays on her medications  Relevant past medical, surgical, family and social history reviewed and updated as indicated. Interim medical history since our last visit reviewed. Allergies and medications reviewed and updated.   Current Outpatient Prescriptions:  .  albuterol (PROVENTIL HFA;VENTOLIN HFA) 108 (90 Base) MCG/ACT inhaler, Inhale 2 puffs into the lungs every 6 (six) hours as needed for wheezing or shortness of breath., Disp: 3 Inhaler, Rfl: 3 .  Fluticasone-Salmeterol (ADVAIR) 250-50 MCG/DOSE AEPB, Inhale 1 puff into the lungs 2 (two) times daily., Disp: 180 each, Rfl: 3 .  Ibuprofen 200 MG CAPS, Take 400 mg by mouth 2 (two) times daily as needed., Disp: , Rfl:  .  lovastatin (MEVACOR) 20 MG tablet, TAKE ONE TABLET BY MOUTH ONCE DAILY AT BEDTIME FOR CHOLESTEROL, Disp: 30 tablet, Rfl: 6 .  omeprazole (PRILOSEC) 20 MG capsule, 1 po 30 mins prior to meals bid for 3 mos then once a week (Patient not taking: Reported on 09/15/2016), Disp: 60 capsule, Rfl: 11   Review of Systems  Constitutional: Negative for appetite change, chills, diaphoresis, fatigue, fever and unexpected weight change.  HENT: Negative for congestion, dental problem, drooling, ear pain, facial swelling, hearing loss, mouth sores, sneezing, sore throat, trouble swallowing and voice change.   Eyes: Negative for pain, discharge, redness, itching and visual disturbance.   Respiratory: Negative for cough, choking, shortness of breath and wheezing.   Cardiovascular: Negative for chest pain, palpitations and leg swelling.  Gastrointestinal: Negative for abdominal pain, blood in stool, constipation, diarrhea and vomiting.  Endocrine: Positive for cold intolerance. Negative for heat intolerance and polydipsia.  Genitourinary: Negative for decreased urine volume, dysuria and hematuria.  Musculoskeletal: Positive for arthralgias and back pain. Negative for gait problem.  Skin: Negative for rash.  Allergic/Immunologic: Positive for environmental allergies.  Neurological: Positive for headaches. Negative for seizures, syncope and light-headedness.  Hematological: Negative for adenopathy.  Psychiatric/Behavioral: Negative for agitation, dysphoric mood and suicidal ideas. The patient is not nervous/anxious.     Per HPI unless specifically indicated above     Objective:    BP 120/60 (BP Location: Left Arm, Patient Position: Sitting, Cuff Size: Normal)   Pulse 90   Temp 99 F (37.2 C)   Wt 138 lb (62.6 kg)   LMP 07/20/2006   SpO2 98%   BMI 25.24 kg/m   Wt Readings from Last 3 Encounters:  09/15/16 138 lb (62.6 kg)  05/19/16 134 lb 8 oz (61 kg)  03/10/16 133 lb 9.6 oz (60.6 kg)    Physical Exam  Constitutional: She is oriented to person, place, and time. She appears well-developed and well-nourished.  HENT:  Head: Normocephalic and atraumatic.  Neck: Neck supple.  Cardiovascular: Normal rate, regular rhythm and normal heart sounds.   Pulmonary/Chest: Effort normal and breath sounds  normal.  Genitourinary: Uterus normal. No breast swelling, tenderness, discharge or bleeding. Pelvic exam was performed with patient supine. Cervix exhibits no motion tenderness.  Genitourinary Comments: Pt extremely tense.  Bimanual exam performed prior to speculum exam and pt still unable to relax, even causing discomfort to examiner due to pt clamping down so much.  Unable to  perform speculum exam and collect PAP sample due to pt unable to cooperate (nurse Berenice assisted)  Neurological: She is alert and oriented to person, place, and time.  Skin: Skin is warm and dry.  Psychiatric: She has a normal mood and affect.  Nursing note and vitals reviewed.   Results for orders placed or performed in visit on 09/09/16  Lipid Profile  Result Value Ref Range   Cholesterol 175 <200 mg/dL   Triglycerides 77 <150 mg/dL   HDL 72 >50 mg/dL   Total CHOL/HDL Ratio 2.4 <5.0 Ratio   VLDL 15 <30 mg/dL   LDL Cholesterol 88 <100 mg/dL  Comprehensive metabolic panel  Result Value Ref Range   Sodium 142 135 - 146 mmol/L   Potassium 4.3 3.5 - 5.3 mmol/L   Chloride 109 98 - 110 mmol/L   CO2 26 20 - 31 mmol/L   Glucose, Bld 89 65 - 99 mg/dL   BUN 13 7 - 25 mg/dL   Creat 0.83 0.50 - 1.05 mg/dL   Total Bilirubin 0.9 0.2 - 1.2 mg/dL   Alkaline Phosphatase 68 33 - 130 U/L   AST 31 10 - 35 U/L   ALT 29 6 - 29 U/L   Total Protein 6.2 6.1 - 8.1 g/dL   Albumin 3.8 3.6 - 5.1 g/dL   Calcium 8.9 8.6 - 10.4 mg/dL      Assessment & Plan:    Encounter Diagnoses  Name Primary?  . Encounter for gynecological examination without abnormal finding Yes  . Asthma, chronic, unspecified asthma severity, uncomplicated   . Hyperlipidemia, unspecified hyperlipidemia type   . Gastroesophageal reflux disease, esophagitis presence not specified   . History of breast cancer     -reviewed labs with pt -will get pt signed up for [facility] to get the omeprazole -Pt to continue other medications -f/u 3 months.  RTO sooner prn

## 2016-12-29 ENCOUNTER — Ambulatory Visit: Payer: Self-pay | Admitting: Physician Assistant

## 2017-01-15 ENCOUNTER — Other Ambulatory Visit (HOSPITAL_COMMUNITY)
Admission: RE | Admit: 2017-01-15 | Discharge: 2017-01-15 | Disposition: A | Discharge status: Home / Self Care | Payer: Self-pay | Source: Ambulatory Visit | Attending: Physician Assistant | Admitting: Physician Assistant

## 2017-01-15 DIAGNOSIS — E785 Hyperlipidemia, unspecified: Secondary | ICD-10-CM | POA: Insufficient documentation

## 2017-01-15 LAB — COMPREHENSIVE METABOLIC PANEL
ALBUMIN: 3.9 g/dL (ref 3.5–5.0)
ALT: 30 U/L (ref 14–54)
AST: 29 U/L (ref 15–41)
Alkaline Phosphatase: 72 U/L (ref 38–126)
Anion gap: 6 (ref 5–15)
BILIRUBIN TOTAL: 1.6 mg/dL — AB (ref 0.3–1.2)
BUN: 17 mg/dL (ref 6–20)
CHLORIDE: 104 mmol/L (ref 101–111)
CO2: 27 mmol/L (ref 22–32)
Calcium: 8.9 mg/dL (ref 8.9–10.3)
Creatinine, Ser: 0.75 mg/dL (ref 0.44–1.00)
GFR calc Af Amer: >60 mL/min (ref >60)
Glucose, Bld: 98 mg/dL (ref 65–99)
POTASSIUM: 4.2 mmol/L (ref 3.5–5.1)
Sodium: 137 mmol/L (ref 135–145)
TOTAL PROTEIN: 6.9 g/dL (ref 6.5–8.1)

## 2017-01-15 LAB — LIPID PANEL
CHOL/HDL RATIO: 2.9 ratio
CHOLESTEROL: 185 mg/dL (ref 0–200)
HDL: 64 mg/dL (ref >40)
LDL Cholesterol: 106 mg/dL — ABNORMAL HIGH (ref 0–99)
TRIGLYCERIDES: 76 mg/dL (ref <150)
VLDL: 15 mg/dL (ref 0–40)

## 2017-01-26 ENCOUNTER — Ambulatory Visit: Payer: Self-pay | Admitting: Physician Assistant

## 2017-02-02 ENCOUNTER — Encounter: Payer: Self-pay | Admitting: Physician Assistant

## 2017-02-02 ENCOUNTER — Ambulatory Visit: Payer: Self-pay | Admitting: Physician Assistant

## 2017-02-02 VITALS — BP 106/70 | HR 97 | Temp 99.0°F | Ht 62.0 in | Wt 138.8 lb

## 2017-02-02 DIAGNOSIS — K219 Gastro-esophageal reflux disease without esophagitis: Secondary | ICD-10-CM

## 2017-02-02 DIAGNOSIS — Z1239 Encounter for other screening for malignant neoplasm of breast: Secondary | ICD-10-CM

## 2017-02-02 DIAGNOSIS — E785 Hyperlipidemia, unspecified: Secondary | ICD-10-CM

## 2017-02-02 DIAGNOSIS — J45909 Unspecified asthma, uncomplicated: Secondary | ICD-10-CM

## 2017-02-02 NOTE — Progress Notes (Signed)
BP 106/70 (BP Location: Left Arm, Patient Position: Sitting, Cuff Size: Normal)   Pulse 97   Temp 99 F (37.2 C)   Ht 5\' 2"  (1.575 m)   Wt 138 lb 12 oz (62.9 kg)   LMP 07/20/2006   SpO2 98%   BMI 25.38 kg/m    Subjective:    Patient ID: Brenda Olsen, female    DOB: [birth year], 58 y.o.   MRN: [id number]  HPI: Brenda Olsen is a 56 y.o. female presenting on 02/02/2017 for Follow-up   HPI   Her GI symptoms are better now that she is on the omeprazole. She says she only takes it when she needs it.  She says her breathing is okay except when she is out doing yard-work.   Relevant past medical, surgical, family and social history reviewed and updated as indicated. Interim medical history since our last visit reviewed. Allergies and medications reviewed and updated.   Current Outpatient Prescriptions:  .  albuterol (PROVENTIL HFA;VENTOLIN HFA) 108 (90 Base) MCG/ACT inhaler, Inhale 2 puffs into the lungs every 6 (six) hours as needed for wheezing or shortness of breath., Disp: 3 Inhaler, Rfl: 3 .  Fluticasone-Salmeterol (ADVAIR) 250-50 MCG/DOSE AEPB, Inhale 1 puff into the lungs 2 (two) times daily., Disp: 180 each, Rfl: 3 .  lovastatin (MEVACOR) 20 MG tablet, TAKE ONE TABLET BY MOUTH ONCE DAILY AT BEDTIME FOR CHOLESTEROL, Disp: 30 tablet, Rfl: 6 .  omeprazole (PRILOSEC) 20 MG capsule, Take 1 capsule (20 mg total) by mouth daily as needed., Disp: 90 capsule, Rfl: 1   Review of Systems  Constitutional: Negative for appetite change, chills, diaphoresis, fatigue, fever and unexpected weight change.  HENT: Negative for congestion, drooling, ear pain, facial swelling, hearing loss, mouth sores, sneezing, sore throat, trouble swallowing and voice change.   Eyes: Negative for pain, discharge, redness, itching and visual disturbance.  Respiratory: Negative for cough, choking, shortness of breath and wheezing.   Cardiovascular: Negative for chest pain, palpitations and leg swelling.   Gastrointestinal: Negative for abdominal pain, blood in stool, constipation, diarrhea and vomiting.  Endocrine: Positive for cold intolerance. Negative for heat intolerance and polydipsia.  Genitourinary: Negative for decreased urine volume, dysuria and hematuria.  Musculoskeletal: Positive for arthralgias. Negative for back pain and gait problem.  Skin: Negative for rash.  Allergic/Immunologic: Positive for environmental allergies.  Neurological: Positive for headaches. Negative for seizures, syncope and light-headedness.  Hematological: Negative for adenopathy.  Psychiatric/Behavioral: Negative for agitation, dysphoric mood and suicidal ideas. The patient is not nervous/anxious.     Per HPI unless specifically indicated above     Objective:    BP 106/70 (BP Location: Left Arm, Patient Position: Sitting, Cuff Size: Normal)   Pulse 97   Temp 99 F (37.2 C)   Ht 5\' 2"  (1.575 m)   Wt 138 lb 12 oz (62.9 kg)   LMP 07/20/2006   SpO2 98%   BMI 25.38 kg/m   Wt Readings from Last 3 Encounters:  02/02/17 138 lb 12 oz (62.9 kg)  09/15/16 138 lb (62.6 kg)  05/19/16 134 lb 8 oz (61 kg)    Physical Exam  Constitutional: She is oriented to person, place, and time. She appears well-developed and well-nourished.  HENT:  Head: Normocephalic and atraumatic.  Neck: Neck supple.  Cardiovascular: Normal rate and regular rhythm.   Pulmonary/Chest: Effort normal and breath sounds normal.  Abdominal: Soft. Bowel sounds are normal. She exhibits no mass. There is no hepatosplenomegaly. There is no tenderness.  Musculoskeletal: She exhibits no edema.  Lymphadenopathy:    She has no cervical adenopathy.  Neurological: She is alert and oriented to person, place, and time.  Skin: Skin is warm and dry.  Psychiatric: She has a normal mood and affect. Her behavior is normal.  Vitals reviewed.   Results for orders placed or performed during the hospital encounter of 01/15/17  Lipid panel  Result  Value Ref Range   Cholesterol 185 0 - 200 mg/dL   Triglycerides 76 <150 mg/dL   HDL 64 >40 mg/dL   Total CHOL/HDL Ratio 2.9 RATIO   VLDL 15 0 - 40 mg/dL   LDL Cholesterol 106 (H) 0 - 99 mg/dL  Comprehensive metabolic panel  Result Value Ref Range   Sodium 137 135 - 145 mmol/L   Potassium 4.2 3.5 - 5.1 mmol/L   Chloride 104 101 - 111 mmol/L   CO2 27 22 - 32 mmol/L   Glucose, Bld 98 65 - 99 mg/dL   BUN 17 6 - 20 mg/dL   Creatinine, Ser 0.75 0.44 - 1.00 mg/dL   Calcium 8.9 8.9 - 10.3 mg/dL   Total Protein 6.9 6.5 - 8.1 g/dL   Albumin 3.9 3.5 - 5.0 g/dL   AST 29 15 - 41 U/L   ALT 30 14 - 54 U/L   Alkaline Phosphatase 72 38 - 126 U/L   Total Bilirubin 1.6 (H) 0.3 - 1.2 mg/dL   GFR calc non Af Amer >60 >60 mL/min   GFR calc Af Amer >60 >60 mL/min   Anion gap 6 5 - 15      Assessment & Plan:   Encounter Diagnoses  Name Primary?  Marland Kitchen Asthma, chronic, unspecified asthma severity, uncomplicated Yes  . Hyperlipidemia, unspecified hyperlipidemia type   . Gastroesophageal reflux disease, esophagitis presence not specified   . Screening for breast cancer     -reviewed labs with pt --pt to Continue current medications -ordered screening mammogram -pt to follow up in 6 months.  Will not order labs to be done prior to appointment because pt hope to be working and have insurance at that time. Pt to RTO sooner prn

## 2017-02-18 ENCOUNTER — Other Ambulatory Visit: Payer: Self-pay | Admitting: Physician Assistant

## 2017-02-22 ENCOUNTER — Other Ambulatory Visit: Payer: Self-pay | Admitting: Physician Assistant

## 2017-02-22 MED ORDER — ALBUTEROL SULFATE HFA 108 (90 BASE) MCG/ACT IN AERS: 2.0000 | INHALATION_SPRAY | Freq: Four times a day (QID) | RESPIRATORY_TRACT | 3 refills | Status: DC | PRN

## 2017-03-30 ENCOUNTER — Other Ambulatory Visit: Payer: Self-pay | Admitting: Physician Assistant

## 2017-05-03 ENCOUNTER — Ambulatory Visit: Payer: Self-pay | Admitting: Physician Assistant

## 2017-07-15 ENCOUNTER — Other Ambulatory Visit: Payer: Self-pay | Admitting: Physician Assistant

## 2017-07-15 ENCOUNTER — Other Ambulatory Visit (HOSPITAL_COMMUNITY): Payer: Self-pay | Admitting: *Deleted

## 2017-07-15 DIAGNOSIS — R229 Localized swelling, mass and lump, unspecified: Principal | ICD-10-CM

## 2017-07-15 DIAGNOSIS — IMO0002 Reserved for concepts with insufficient information to code with codable children: Secondary | ICD-10-CM

## 2017-07-15 MED ORDER — SIMVASTATIN 20 MG PO TABS: 20.0000 mg | ORAL_TABLET | Freq: Every day | ORAL | 3 refills | Status: DC

## 2017-07-21 ENCOUNTER — Other Ambulatory Visit: Payer: Self-pay | Admitting: Physician Assistant

## 2017-07-21 MED ORDER — OMEPRAZOLE 20 MG PO CPDR: 20.0000 mg | DELAYED_RELEASE_CAPSULE | Freq: Every day | ORAL | 1 refills | Status: DC | PRN

## 2017-07-21 MED ORDER — ALBUTEROL SULFATE HFA 108 (90 BASE) MCG/ACT IN AERS: 2.0000 | INHALATION_SPRAY | Freq: Four times a day (QID) | RESPIRATORY_TRACT | 3 refills | Status: DC | PRN

## 2017-08-03 ENCOUNTER — Ambulatory Visit: Payer: Self-pay | Admitting: Physician Assistant

## 2017-08-09 ENCOUNTER — Encounter: Payer: Self-pay | Admitting: Physician Assistant

## 2017-08-10 ENCOUNTER — Encounter: Payer: Self-pay | Admitting: Physician Assistant

## 2017-08-10 ENCOUNTER — Ambulatory Visit: Payer: Self-pay | Admitting: Physician Assistant

## 2017-08-10 VITALS — BP 110/68 | HR 84 | Temp 97.7°F | Ht 62.0 in | Wt 141.5 lb

## 2017-08-10 DIAGNOSIS — E785 Hyperlipidemia, unspecified: Secondary | ICD-10-CM

## 2017-08-10 DIAGNOSIS — J45909 Unspecified asthma, uncomplicated: Secondary | ICD-10-CM

## 2017-08-10 NOTE — Progress Notes (Signed)
BP 110/68   Pulse 84   Temp 97.7 F (36.5 C)   Ht 5\' 2"  (1.575 m)   Wt 141 lb 8 oz (64.2 kg)   LMP 07/20/2006   SpO2 99%   BMI 25.88 kg/m    Subjective:    Patient ID: Brenda Olsen, female    DOB: 1961/03/04, 57 y.o.   MRN: [id number]  HPI: Brenda Olsen is a 57 y.o. female presenting on 08/10/2017 for Hyperlipidemia and Asthma   HPI   Pt changed from lovastatin to simvastatin because of change in $4 list at [place]  She is doing well and has no complaints  Relevant past medical, surgical, family and social history reviewed and updated as indicated. Interim medical history since our last visit reviewed. Allergies and medications reviewed and updated.   Current Outpatient Medications:  .  albuterol (PROVENTIL HFA;VENTOLIN HFA) 108 (90 Base) MCG/ACT inhaler, Inhale 2 puffs into the lungs every 6 (six) hours as needed for wheezing or shortness of breath., Disp: 3 Inhaler, Rfl: 3 .  Fluticasone-Salmeterol (ADVAIR) 250-50 MCG/DOSE AEPB, Inhale 1 puff into the lungs 2 (two) times daily., Disp: 180 each, Rfl: 3 .  omeprazole (PRILOSEC) 20 MG capsule, Take 1 capsule (20 mg total) by mouth daily as needed., Disp: 90 capsule, Rfl: 1 .  simvastatin (ZOCOR) 20 MG tablet, Take 1 tablet (20 mg total) by mouth at bedtime., Disp: 30 tablet, Rfl: 3   Review of Systems  Constitutional: Negative for appetite change, chills, diaphoresis, fatigue, fever and unexpected weight change.  HENT: Negative for congestion, dental problem, drooling, ear pain, facial swelling, hearing loss, mouth sores, sneezing, sore throat, trouble swallowing and voice change.   Eyes: Negative for pain, discharge, redness, itching and visual disturbance.  Respiratory: Negative for cough, choking, shortness of breath and wheezing.   Cardiovascular: Negative for chest pain, palpitations and leg swelling.  Gastrointestinal: Negative for abdominal pain, blood in stool, constipation, diarrhea and vomiting.  Endocrine:  Negative for cold intolerance, heat intolerance and polydipsia.  Genitourinary: Negative for decreased urine volume, dysuria and hematuria.  Musculoskeletal: Positive for arthralgias and back pain. Negative for gait problem.  Skin: Negative for rash.  Allergic/Immunologic: Negative for environmental allergies.  Neurological: Negative for seizures, syncope, light-headedness and headaches.  Hematological: Negative for adenopathy.  Psychiatric/Behavioral: Negative for agitation, dysphoric mood and suicidal ideas. The patient is not nervous/anxious.     Per HPI unless specifically indicated above     Objective:    BP 110/68   Pulse 84   Temp 97.7 F (36.5 C)   Ht 5\' 2"  (1.575 m)   Wt 141 lb 8 oz (64.2 kg)   LMP 07/20/2006   SpO2 99%   BMI 25.88 kg/m   Wt Readings from Last 3 Encounters:  08/10/17 141 lb 8 oz (64.2 kg)  02/02/17 138 lb 12 oz (62.9 kg)  09/15/16 138 lb (62.6 kg)    Physical Exam  Constitutional: She is oriented to person, place, and time. She appears well-developed and well-nourished.  HENT:  Head: Normocephalic and atraumatic.  Neck: Neck supple.  Cardiovascular: Normal rate and regular rhythm.  Pulmonary/Chest: Effort normal and breath sounds normal.  Abdominal: Soft. Bowel sounds are normal. She exhibits no mass. There is no hepatosplenomegaly. There is no tenderness.  Musculoskeletal: She exhibits no edema.  Lymphadenopathy:    She has no cervical adenopathy.  Neurological: She is alert and oriented to person, place, and time.  Skin: Skin is warm and dry.  Psychiatric:  She has a normal mood and affect. Her behavior is normal.  Vitals reviewed.       Assessment & Plan:    Encounter Diagnoses  Name Primary?  Marland Kitchen Asthma, chronic, unspecified asthma severity, uncomplicated Yes  . Hyperlipidemia, unspecified hyperlipidemia type     -pt to get fasting labs drawn.  We will call her with results -pt was given [facility] discount application -pt has  appointment for mammogram -pt to continue current medications -pt to follow up in 3 months.  RTO sooner prn

## 2017-08-13 ENCOUNTER — Other Ambulatory Visit (HOSPITAL_COMMUNITY)
Admission: RE | Admit: 2017-08-13 | Discharge: 2017-08-13 | Disposition: A | Discharge status: Home / Self Care | Payer: Self-pay | Source: Ambulatory Visit | Attending: Physician Assistant | Admitting: Physician Assistant

## 2017-08-13 DIAGNOSIS — E785 Hyperlipidemia, unspecified: Secondary | ICD-10-CM | POA: Insufficient documentation

## 2017-08-13 LAB — COMPREHENSIVE METABOLIC PANEL
ALT: 34 U/L (ref 14–54)
ANION GAP: 10 (ref 5–15)
AST: 31 U/L (ref 15–41)
Albumin: 4 g/dL (ref 3.5–5.0)
Alkaline Phosphatase: 77 U/L (ref 38–126)
BUN: 16 mg/dL (ref 6–20)
CHLORIDE: 106 mmol/L (ref 101–111)
CO2: 25 mmol/L (ref 22–32)
Calcium: 9.1 mg/dL (ref 8.9–10.3)
Creatinine, Ser: 0.78 mg/dL (ref 0.44–1.00)
GFR calc Af Amer: >60 mL/min (ref >60)
GFR calc non Af Amer: >60 mL/min (ref >60)
Glucose, Bld: 102 mg/dL — ABNORMAL HIGH (ref 65–99)
POTASSIUM: 4.3 mmol/L (ref 3.5–5.1)
Sodium: 141 mmol/L (ref 135–145)
Total Bilirubin: 1.6 mg/dL — ABNORMAL HIGH (ref 0.3–1.2)
Total Protein: 7 g/dL (ref 6.5–8.1)

## 2017-08-13 LAB — LIPID PANEL
Cholesterol: 212 mg/dL — ABNORMAL HIGH (ref 0–200)
HDL: 69 mg/dL (ref >40)
LDL CALC: 129 mg/dL — AB (ref 0–99)
Total CHOL/HDL Ratio: 3.1 RATIO
Triglycerides: 70 mg/dL (ref <150)
VLDL: 14 mg/dL (ref 0–40)

## 2017-09-21 ENCOUNTER — Other Ambulatory Visit (HOSPITAL_COMMUNITY): Payer: Self-pay | Admitting: *Deleted

## 2017-09-21 ENCOUNTER — Ambulatory Visit (HOSPITAL_COMMUNITY)
Admission: RE | Admit: 2017-09-21 | Discharge: 2017-09-21 | Disposition: A | Discharge status: Home / Self Care | Payer: PRIVATE HEALTH INSURANCE | Source: Ambulatory Visit | Attending: *Deleted | Admitting: *Deleted

## 2017-09-21 ENCOUNTER — Encounter (HOSPITAL_COMMUNITY): Payer: Self-pay

## 2017-09-21 DIAGNOSIS — Z853 Personal history of malignant neoplasm of breast: Secondary | ICD-10-CM

## 2017-09-21 DIAGNOSIS — R928 Other abnormal and inconclusive findings on diagnostic imaging of breast: Secondary | ICD-10-CM | POA: Diagnosis not present

## 2017-09-21 DIAGNOSIS — R229 Localized swelling, mass and lump, unspecified: Secondary | ICD-10-CM | POA: Diagnosis present

## 2017-09-21 DIAGNOSIS — IMO0002 Reserved for concepts with insufficient information to code with codable children: Secondary | ICD-10-CM

## 2017-09-21 HISTORY — DX: Personal history of irradiation: Z92.3

## 2017-11-16 ENCOUNTER — Encounter: Payer: Self-pay | Admitting: Physician Assistant

## 2017-11-16 ENCOUNTER — Ambulatory Visit: Payer: Self-pay | Admitting: Physician Assistant

## 2017-11-16 VITALS — BP 102/66 | HR 78 | Temp 98.2°F | Ht 62.0 in | Wt 143.5 lb

## 2017-11-16 DIAGNOSIS — K219 Gastro-esophageal reflux disease without esophagitis: Secondary | ICD-10-CM

## 2017-11-16 DIAGNOSIS — J45909 Unspecified asthma, uncomplicated: Secondary | ICD-10-CM

## 2017-11-16 DIAGNOSIS — E785 Hyperlipidemia, unspecified: Secondary | ICD-10-CM

## 2017-11-16 MED ORDER — SIMVASTATIN 20 MG PO TABS: 20.0000 mg | ORAL_TABLET | Freq: Every day | ORAL | 3 refills | Status: DC

## 2017-11-16 NOTE — Progress Notes (Signed)
BP 102/66 (BP Location: Left Arm, Patient Position: Sitting, Cuff Size: Normal)   Pulse 78   Temp 98.2 F (36.8 C)   Ht 5\' 2"  (1.575 m)   Wt 143 lb 8 oz (65.1 kg)   LMP 07/20/2006   SpO2 98%   BMI 26.25 kg/m    Subjective:    Patient ID: Brenda Olsen, female    DOB: 1961/06/30, 57 y.o.   MRN: [id number]  HPI: Brenda Olsen is a 57 y.o. female presenting on 11/16/2017 for Hyperlipidemia and Asthma   HPI   She gets to wheezing at times due to the season, she says.  Her inhalers work when she needs them  Pt was asked about her sending her to gyn for PAP and she says maybe but not right now because she has too much going on.    Relevant past medical, surgical, family and social history reviewed and updated as indicated. Interim medical history since our last visit reviewed. Allergies and medications reviewed and updated.    Current Outpatient Medications:  .  albuterol (PROVENTIL HFA;VENTOLIN HFA) 108 (90 Base) MCG/ACT inhaler, Inhale 2 puffs into the lungs every 6 (six) hours as needed for wheezing or shortness of breath., Disp: 3 Inhaler, Rfl: 3 .  Fluticasone-Salmeterol (ADVAIR) 250-50 MCG/DOSE AEPB, Inhale 1 puff into the lungs 2 (two) times daily., Disp: 180 each, Rfl: 3 .  omeprazole (PRILOSEC) 20 MG capsule, Take 1 capsule (20 mg total) by mouth daily as needed., Disp: 90 capsule, Rfl: 1 .  simvastatin (ZOCOR) 20 MG tablet, Take 1 tablet (20 mg total) by mouth at bedtime., Disp: 30 tablet, Rfl: 3  Review of Systems  Constitutional: Negative for appetite change, chills, diaphoresis, fatigue, fever and unexpected weight change.  HENT: Positive for congestion. Negative for dental problem, drooling, ear pain, facial swelling, hearing loss, mouth sores, sneezing, sore throat, trouble swallowing and voice change.   Eyes: Negative for pain, discharge, redness, itching and visual disturbance.  Respiratory: Negative for cough, choking, shortness of breath and wheezing.    Cardiovascular: Negative for chest pain, palpitations and leg swelling.  Gastrointestinal: Negative for abdominal pain, blood in stool, constipation, diarrhea and vomiting.  Endocrine: Negative for cold intolerance, heat intolerance and polydipsia.  Genitourinary: Negative for decreased urine volume, dysuria and hematuria.  Musculoskeletal: Positive for arthralgias and back pain. Negative for gait problem.  Skin: Negative for rash.  Allergic/Immunologic: Positive for environmental allergies.  Neurological: Negative for seizures, syncope, light-headedness and headaches.  Hematological: Negative for adenopathy.  Psychiatric/Behavioral: Negative for agitation, dysphoric mood and suicidal ideas. The patient is not nervous/anxious.     Per HPI unless specifically indicated above     Objective:    BP 102/66 (BP Location: Left Arm, Patient Position: Sitting, Cuff Size: Normal)   Pulse 78   Temp 98.2 F (36.8 C)   Ht 5\' 2"  (1.575 m)   Wt 143 lb 8 oz (65.1 kg)   LMP 07/20/2006   SpO2 98%   BMI 26.25 kg/m   Wt Readings from Last 3 Encounters:  11/16/17 143 lb 8 oz (65.1 kg)  08/10/17 141 lb 8 oz (64.2 kg)  02/02/17 138 lb 12 oz (62.9 kg)    Physical Exam  Constitutional: She is oriented to person, place, and time. She appears well-developed and well-nourished.  HENT:  Head: Normocephalic and atraumatic.  Neck: Neck supple.  Cardiovascular: Normal rate and regular rhythm.  Pulmonary/Chest: Effort normal and breath sounds normal.  Abdominal: Soft. Bowel sounds are  normal. She exhibits no mass. There is no hepatosplenomegaly. There is no tenderness.  Musculoskeletal: She exhibits no edema.  Lymphadenopathy:    She has no cervical adenopathy.  Neurological: She is alert and oriented to person, place, and time.  Skin: Skin is warm and dry.  Psychiatric: She has a normal mood and affect. Her behavior is normal.  Vitals reviewed.       Assessment & Plan:    Encounter Diagnoses   Name Primary?  Marland Kitchen Asthma, chronic, unspecified asthma severity, uncomplicated Yes  . Hyperlipidemia, unspecified hyperlipidemia type   . Gastroesophageal reflux disease, esophagitis presence not specified     -pt to continue current medications -check fasting lipids -pt to follow up 3 months.  RTO sooner prn

## 2017-11-26 ENCOUNTER — Other Ambulatory Visit (HOSPITAL_COMMUNITY)
Admission: RE | Admit: 2017-11-26 | Discharge: 2017-11-26 | Disposition: A | Discharge status: Home / Self Care | Payer: Self-pay | Source: Ambulatory Visit | Attending: Physician Assistant | Admitting: Physician Assistant

## 2017-11-26 DIAGNOSIS — E785 Hyperlipidemia, unspecified: Secondary | ICD-10-CM | POA: Insufficient documentation

## 2017-11-26 LAB — COMPREHENSIVE METABOLIC PANEL
ALT: 29 U/L (ref 14–54)
AST: 27 U/L (ref 15–41)
Albumin: 3.8 g/dL (ref 3.5–5.0)
Alkaline Phosphatase: 67 U/L (ref 38–126)
Anion gap: 6 (ref 5–15)
BUN: 16 mg/dL (ref 6–20)
CHLORIDE: 107 mmol/L (ref 101–111)
CO2: 27 mmol/L (ref 22–32)
Calcium: 9.1 mg/dL (ref 8.9–10.3)
Creatinine, Ser: 0.78 mg/dL (ref 0.44–1.00)
Glucose, Bld: 101 mg/dL — ABNORMAL HIGH (ref 65–99)
POTASSIUM: 4.2 mmol/L (ref 3.5–5.1)
Sodium: 140 mmol/L (ref 135–145)
TOTAL PROTEIN: 6.7 g/dL (ref 6.5–8.1)
Total Bilirubin: 1.7 mg/dL — ABNORMAL HIGH (ref 0.3–1.2)

## 2017-11-26 LAB — LIPID PANEL
CHOL/HDL RATIO: 2.5 ratio
Cholesterol: 168 mg/dL (ref 0–200)
HDL: 67 mg/dL (ref >40)
LDL CALC: 91 mg/dL (ref 0–99)
TRIGLYCERIDES: 52 mg/dL (ref <150)
VLDL: 10 mg/dL (ref 0–40)

## 2017-12-30 ENCOUNTER — Encounter: Payer: Self-pay | Admitting: Student

## 2018-02-15 ENCOUNTER — Other Ambulatory Visit: Payer: Self-pay | Admitting: Physician Assistant

## 2018-02-15 ENCOUNTER — Ambulatory Visit: Payer: Self-pay | Admitting: Physician Assistant

## 2018-02-15 MED ORDER — ALBUTEROL SULFATE HFA 108 (90 BASE) MCG/ACT IN AERS: 2.0000 | INHALATION_SPRAY | Freq: Four times a day (QID) | RESPIRATORY_TRACT | 3 refills | Status: DC | PRN

## 2018-02-15 MED ORDER — FLUTICASONE-SALMETEROL 250-50 MCG/DOSE IN AEPB: 1.0000 | INHALATION_SPRAY | Freq: Two times a day (BID) | RESPIRATORY_TRACT | 3 refills | Status: DC

## 2018-03-08 ENCOUNTER — Encounter: Payer: Self-pay | Admitting: Physician Assistant

## 2018-03-08 ENCOUNTER — Ambulatory Visit: Payer: Self-pay | Admitting: Physician Assistant

## 2018-03-08 VITALS — BP 99/65 | HR 62 | Temp 98.0°F | Ht 62.0 in | Wt 142.5 lb

## 2018-03-08 DIAGNOSIS — E785 Hyperlipidemia, unspecified: Secondary | ICD-10-CM

## 2018-03-08 DIAGNOSIS — R87615 Unsatisfactory cytologic smear of cervix: Secondary | ICD-10-CM

## 2018-03-08 DIAGNOSIS — J45909 Unspecified asthma, uncomplicated: Secondary | ICD-10-CM

## 2018-03-08 NOTE — Progress Notes (Signed)
BP 99/65 (BP Location: Right Arm, Patient Position: Sitting, Cuff Size: Normal)   Pulse 62   Temp 98 F (36.7 C)   Ht 5\' 2"  (1.575 m)   Wt 142 lb 8 oz (64.6 kg)   LMP 07/20/2006   SpO2 100%   BMI 26.06 kg/m    Subjective:    Patient ID: Brenda Olsen, female    DOB: November 13, 1960, 57 y.o.   MRN: [id number]  HPI: Brenda Olsen is a 57 y.o. female presenting on 03/08/2018 for Asthma and Hyperlipidemia   HPI   Pt is doing well.  She only has some problems with her breathing if she goes out into the hot, humid weather  Relevant past medical, surgical, family and social history reviewed and updated as indicated. Interim medical history since our last visit reviewed. Allergies and medications reviewed and updated.   Current Outpatient Medications:  .  albuterol (PROVENTIL HFA;VENTOLIN HFA) 108 (90 Base) MCG/ACT inhaler, Inhale 2 puffs into the lungs every 6 (six) hours as needed for wheezing or shortness of breath., Disp: 3 Inhaler, Rfl: 3 .  Fluticasone-Salmeterol (ADVAIR) 250-50 MCG/DOSE AEPB, Inhale 1 puff into the lungs 2 (two) times daily., Disp: 180 each, Rfl: 3 .  omeprazole (PRILOSEC) 20 MG capsule, Take 1 capsule (20 mg total) by mouth daily as needed., Disp: 90 capsule, Rfl: 1 .  simvastatin (ZOCOR) 20 MG tablet, Take 1 tablet (20 mg total) by mouth at bedtime., Disp: 30 tablet, Rfl: 3  Review of Systems  Constitutional: Negative for appetite change, chills, diaphoresis, fatigue, fever and unexpected weight change.  HENT: Negative for congestion, dental problem, drooling, ear pain, facial swelling, hearing loss, mouth sores, sneezing, sore throat, trouble swallowing and voice change.   Eyes: Negative for pain, discharge, redness, itching and visual disturbance.  Respiratory: Negative for cough, choking, shortness of breath and wheezing.   Cardiovascular: Negative for chest pain, palpitations and leg swelling.  Gastrointestinal: Negative for abdominal pain, blood in stool,  constipation, diarrhea and vomiting.  Endocrine: Negative for cold intolerance, heat intolerance and polydipsia.  Genitourinary: Negative for decreased urine volume, dysuria and hematuria.  Musculoskeletal: Negative for arthralgias, back pain and gait problem.  Skin: Negative for rash.  Allergic/Immunologic: Negative for environmental allergies.  Neurological: Negative for seizures, syncope, light-headedness and headaches.  Hematological: Negative for adenopathy.  Psychiatric/Behavioral: Negative for agitation, dysphoric mood and suicidal ideas. The patient is not nervous/anxious.     Per HPI unless specifically indicated above     Objective:    BP 99/65 (BP Location: Right Arm, Patient Position: Sitting, Cuff Size: Normal)   Pulse 62   Temp 98 F (36.7 C)   Ht 5\' 2"  (1.575 m)   Wt 142 lb 8 oz (64.6 kg)   LMP 07/20/2006   SpO2 100%   BMI 26.06 kg/m   Wt Readings from Last 3 Encounters:  03/08/18 142 lb 8 oz (64.6 kg)  11/16/17 143 lb 8 oz (65.1 kg)  08/10/17 141 lb 8 oz (64.2 kg)    Physical Exam  Constitutional: She is oriented to person, place, and time. She appears well-developed and well-nourished.  HENT:  Head: Normocephalic and atraumatic.  Neck: Neck supple.  Cardiovascular: Normal rate and regular rhythm.  Pulmonary/Chest: Effort normal and breath sounds normal.  Abdominal: Soft. Bowel sounds are normal. She exhibits no mass. There is no hepatosplenomegaly. There is no tenderness.  Musculoskeletal: She exhibits no edema.  Lymphadenopathy:    She has no cervical adenopathy.  Neurological: She  is alert and oriented to person, place, and time.  Skin: Skin is warm and dry.  Psychiatric: She has a normal mood and affect. Her behavior is normal.  Vitals reviewed.       Assessment & Plan:   Encounter Diagnoses  Name Primary?  . Hyperlipidemia, unspecified hyperlipidemia type Yes  . Asthma, chronic, unspecified asthma severity, uncomplicated   . Pap smear of  cervix unsatisfactory     -Check lipids.  Will call with results -pt to continue current medications -Refer to gyn for difficult PAP.  She has [facility] (75%) -pt to follow up 1 st week of December.  RTO sooner prn

## 2018-03-14 ENCOUNTER — Encounter: Payer: Self-pay | Admitting: *Deleted

## 2018-03-15 ENCOUNTER — Other Ambulatory Visit: Payer: Self-pay | Admitting: Physician Assistant

## 2018-03-15 ENCOUNTER — Other Ambulatory Visit (HOSPITAL_COMMUNITY)
Admission: RE | Admit: 2018-03-15 | Discharge: 2018-03-15 | Disposition: A | Discharge status: Home / Self Care | Payer: Self-pay | Source: Ambulatory Visit | Attending: Physician Assistant | Admitting: Physician Assistant

## 2018-03-15 DIAGNOSIS — E785 Hyperlipidemia, unspecified: Secondary | ICD-10-CM | POA: Insufficient documentation

## 2018-03-15 LAB — COMPREHENSIVE METABOLIC PANEL
ALT: 34 U/L (ref 0–44)
AST: 32 U/L (ref 15–41)
Albumin: 3.8 g/dL (ref 3.5–5.0)
Alkaline Phosphatase: 75 U/L (ref 38–126)
Anion gap: 7 (ref 5–15)
BILIRUBIN TOTAL: 1.6 mg/dL — AB (ref 0.3–1.2)
BUN: 14 mg/dL (ref 6–20)
CO2: 26 mmol/L (ref 22–32)
CREATININE: 0.79 mg/dL (ref 0.44–1.00)
Calcium: 9.1 mg/dL (ref 8.9–10.3)
Chloride: 109 mmol/L (ref 98–111)
GFR calc Af Amer: >60 mL/min (ref >60)
Glucose, Bld: 102 mg/dL — ABNORMAL HIGH (ref 70–99)
Potassium: 4.2 mmol/L (ref 3.5–5.1)
Sodium: 142 mmol/L (ref 135–145)
TOTAL PROTEIN: 7 g/dL (ref 6.5–8.1)

## 2018-03-15 LAB — LIPID PANEL
CHOLESTEROL: 187 mg/dL (ref 0–200)
HDL: 66 mg/dL (ref >40)
LDL Cholesterol: 107 mg/dL — ABNORMAL HIGH (ref 0–99)
TRIGLYCERIDES: 72 mg/dL (ref <150)
Total CHOL/HDL Ratio: 2.8 RATIO
VLDL: 14 mg/dL (ref 0–40)

## 2018-04-21 ENCOUNTER — Other Ambulatory Visit: Payer: Self-pay | Admitting: Physician Assistant

## 2018-06-21 ENCOUNTER — Ambulatory Visit: Payer: Self-pay | Admitting: Physician Assistant

## 2018-07-26 ENCOUNTER — Ambulatory Visit: Payer: Self-pay | Admitting: Physician Assistant

## 2018-10-17 ENCOUNTER — Other Ambulatory Visit: Payer: Self-pay | Admitting: Physician Assistant

## 2018-11-16 ENCOUNTER — Encounter: Payer: Self-pay | Admitting: Physician Assistant

## 2018-11-16 ENCOUNTER — Ambulatory Visit: Payer: Self-pay | Admitting: Physician Assistant

## 2018-11-16 DIAGNOSIS — E785 Hyperlipidemia, unspecified: Secondary | ICD-10-CM

## 2018-11-16 DIAGNOSIS — Z9119 Patient's noncompliance with other medical treatment and regimen: Secondary | ICD-10-CM

## 2018-11-16 DIAGNOSIS — J45909 Unspecified asthma, uncomplicated: Secondary | ICD-10-CM

## 2018-11-16 DIAGNOSIS — Z91199 Patient's noncompliance with other medical treatment and regimen due to unspecified reason: Secondary | ICD-10-CM

## 2018-11-16 DIAGNOSIS — K219 Gastro-esophageal reflux disease without esophagitis: Secondary | ICD-10-CM

## 2018-11-16 MED ORDER — SIMVASTATIN 20 MG PO TABS: 20.0000 mg | ORAL_TABLET | Freq: Every day | ORAL | 0 refills | Status: DC

## 2018-11-16 NOTE — Progress Notes (Signed)
   LMP 07/20/2006    Subjective:    Patient ID: Brenda Olsen, female    DOB: 04/18/1961, 58 y.o.   MRN: [id number]  HPI: Brenda Olsen is a 58 y.o. female presenting on 11/16/2018 for No chief complaint on file.   HPI    This is a telemedicine appointment due to coronavirus pandemic.  It is via telephone as pt does not have a smart phone  I connected with  [name] on 11/16/18 by a video enabled telemedicine application and verified that I am speaking with the correct person using two identifiers.   I discussed the limitations of evaluation and management by telemedicine. The patient expressed understanding and agreed to proceed.   Pt is months and months past due for appointment.  She cancelled appointments in December and again in January and is just now coming in for appointment  She was last seen in august 2019.    Pt says she ran Out of simvastatin this past weekend.    She says she is doing okay but it's allergy season-   She is Using benedryl and sudafed prn.   She is having mild allergy symptoms but nothing else.  Pt is not working currently due to CV19.  She is mostly staying at home and she wears a face covering when she has to go to the store.     Relevant past medical, surgical, family and social history reviewed and updated as indicated. Interim medical history since our last visit reviewed. Allergies and medications reviewed and updated.   Current Outpatient Medications:  .  albuterol (PROVENTIL HFA;VENTOLIN HFA) 108 (90 Base) MCG/ACT inhaler, Inhale 2 puffs into the lungs every 6 (six) hours as needed for wheezing or shortness of breath., Disp: 3 Inhaler, Rfl: 3 .  Fluticasone-Salmeterol (ADVAIR) 250-50 MCG/DOSE AEPB, Inhale 1 puff into the lungs 2 (two) times daily., Disp: 180 each, Rfl: 3 .  omeprazole (PRILOSEC) 20 MG capsule, Take 1 capsule (20 mg total) by mouth daily as needed., Disp: 90 capsule, Rfl: 1 .  simvastatin (ZOCOR) 20 MG tablet, TAKE 1 TABLET BY  MOUTH AT BEDTIME (Patient not taking: Reported on 11/16/2018), Disp: 90 tablet, Rfl: 1   Review of Systems  Per HPI unless specifically indicated above     Objective:    LMP 07/20/2006   Wt Readings from Last 3 Encounters:  03/08/18 142 lb 8 oz (64.6 kg)  11/16/17 143 lb 8 oz (65.1 kg)  08/10/17 141 lb 8 oz (64.2 kg)    Physical Exam Pulmonary:     Effort: Pulmonary effort is normal. No respiratory distress.     Comments: Pt is speaking in complete sentences without dyspnea. Neurological:     Mental Status: She is alert and oriented to person, place, and time.  Psychiatric:        Attention and Perception: Attention normal.        Speech: Speech normal.           Assessment & Plan:    Encounter Diagnoses  Name Primary?  . Hyperlipidemia, unspecified hyperlipidemia type Yes  . Asthma, chronic, unspecified asthma severity, uncomplicated   . Personal history of noncompliance with medical treatment, presenting hazards to health   . Gastroesophageal reflux disease, esophagitis presence not specified     -pt to continue current medications -pt will need to update labs before next OV -pt to follow up in 2 months.  She is to contact office sooner prn

## 2018-12-27 ENCOUNTER — Other Ambulatory Visit: Payer: Self-pay | Admitting: Physician Assistant

## 2018-12-27 MED ORDER — FLUTICASONE-SALMETEROL 250-50 MCG/DOSE IN AEPB: 1.0000 | INHALATION_SPRAY | Freq: Two times a day (BID) | RESPIRATORY_TRACT | 3 refills | Status: DC

## 2018-12-27 MED ORDER — ALBUTEROL SULFATE HFA 108 (90 BASE) MCG/ACT IN AERS: 2.0000 | INHALATION_SPRAY | Freq: Four times a day (QID) | RESPIRATORY_TRACT | 3 refills | Status: DC | PRN

## 2019-01-14 ENCOUNTER — Other Ambulatory Visit: Payer: Self-pay | Admitting: Physician Assistant

## 2019-01-14 DIAGNOSIS — E785 Hyperlipidemia, unspecified: Secondary | ICD-10-CM

## 2019-01-20 ENCOUNTER — Other Ambulatory Visit (HOSPITAL_COMMUNITY)
Admission: RE | Admit: 2019-01-20 | Discharge: 2019-01-20 | Disposition: A | Discharge status: Home / Self Care | Payer: Self-pay | Source: Other Acute Inpatient Hospital | Attending: Physician Assistant | Admitting: Physician Assistant

## 2019-01-20 DIAGNOSIS — E785 Hyperlipidemia, unspecified: Secondary | ICD-10-CM | POA: Insufficient documentation

## 2019-01-20 LAB — COMPREHENSIVE METABOLIC PANEL
ALT: 38 U/L (ref 0–44)
AST: 35 U/L (ref 15–41)
Albumin: 3.9 g/dL (ref 3.5–5.0)
Alkaline Phosphatase: 80 U/L (ref 38–126)
Anion gap: 9 (ref 5–15)
BUN: 17 mg/dL (ref 6–20)
CO2: 24 mmol/L (ref 22–32)
Calcium: 8.9 mg/dL (ref 8.9–10.3)
Chloride: 108 mmol/L (ref 98–111)
Creatinine, Ser: 0.74 mg/dL (ref 0.44–1.00)
GFR calc Af Amer: >60 mL/min (ref >60)
GFR calc non Af Amer: >60 mL/min (ref >60)
Glucose, Bld: 94 mg/dL (ref 70–99)
Potassium: 4.1 mmol/L (ref 3.5–5.1)
Sodium: 141 mmol/L (ref 135–145)
Total Bilirubin: 1.3 mg/dL — ABNORMAL HIGH (ref 0.3–1.2)
Total Protein: 6.7 g/dL (ref 6.5–8.1)

## 2019-01-20 LAB — LIPID PANEL
Cholesterol: 171 mg/dL (ref 0–200)
HDL: 66 mg/dL (ref >40)
LDL Cholesterol: 90 mg/dL (ref 0–99)
Total CHOL/HDL Ratio: 2.6 RATIO
Triglycerides: 74 mg/dL (ref <150)
VLDL: 15 mg/dL (ref 0–40)

## 2019-01-23 ENCOUNTER — Ambulatory Visit: Payer: Self-pay | Admitting: Physician Assistant

## 2019-01-23 ENCOUNTER — Encounter: Payer: Self-pay | Admitting: Physician Assistant

## 2019-01-23 DIAGNOSIS — Z1239 Encounter for other screening for malignant neoplasm of breast: Secondary | ICD-10-CM

## 2019-01-23 DIAGNOSIS — J45909 Unspecified asthma, uncomplicated: Secondary | ICD-10-CM

## 2019-01-23 DIAGNOSIS — K219 Gastro-esophageal reflux disease without esophagitis: Secondary | ICD-10-CM

## 2019-01-23 DIAGNOSIS — E785 Hyperlipidemia, unspecified: Secondary | ICD-10-CM

## 2019-01-23 MED ORDER — SIMVASTATIN 20 MG PO TABS: 20.0000 mg | ORAL_TABLET | Freq: Every day | ORAL | 1 refills | Status: DC

## 2019-01-23 NOTE — Progress Notes (Signed)
LMP 07/20/2006    Subjective:    Patient ID: Brenda Olsen, female    DOB: November 10, 1960, 58 y.o.   MRN: [id number]  HPI: Brenda Olsen is a 58 y.o. female presenting on 01/23/2019 for Hyperlipidemia and Asthma   HPI    This is a telemedicine appointment due to coronavirus pandemic.  It is via Telephone as pt does not have a cellular phone with video capabilities.  I connected with  Brenda Olsen on 01/23/19 by a video enabled telemedicine application and verified that I am speaking with the correct person using two identifiers.   I discussed the limitations of evaluation and management by telemedicine. The patient expressed understanding and agreed to proceed.   Pt is at home.  Provider is at office   Pt is doing well and has no complaints.   She says her breathing/asthma is stable.  Her mood is good.  Her gerd is controlled.    She is wearing a mask when she goes out to public places  Relevant past medical, surgical, family and social history reviewed and updated as indicated. Interim medical history since our last visit reviewed. Allergies and medications reviewed and updated.   Current Outpatient Medications:  .  albuterol (VENTOLIN HFA) 108 (90 Base) MCG/ACT inhaler, Inhale 2 puffs into the lungs every 6 (six) hours as needed for wheezing or shortness of breath., Disp: 3 Inhaler, Rfl: 3 .  Fluticasone-Salmeterol (ADVAIR) 250-50 MCG/DOSE AEPB, Inhale 1 puff into the lungs 2 (two) times daily., Disp: 180 each, Rfl: 3 .  omeprazole (PRILOSEC) 20 MG capsule, Take 1 capsule (20 mg total) by mouth daily as needed., Disp: 90 capsule, Rfl: 1 .  simvastatin (ZOCOR) 20 MG tablet, Take 1 tablet (20 mg total) by mouth at bedtime., Disp: 90 tablet, Rfl: 0     Review of Systems  Per HPI unless specifically indicated above     Objective:    LMP 07/20/2006   Wt Readings from Last 3 Encounters:  03/08/18 142 lb 8 oz (64.6 kg)  11/16/17 143 lb 8 oz (65.1 kg)  08/10/17 141 lb 8 oz  (64.2 kg)    Physical Exam Pulmonary:     Effort: No respiratory distress.  Neurological:     Mental Status: She is alert and oriented to person, place, and time.  Psychiatric:        Attention and Perception: Attention normal.        Mood and Affect: Mood normal.        Speech: Speech normal.        Behavior: Behavior is cooperative.        Cognition and Memory: Cognition normal.     Results for orders placed or performed during the hospital encounter of 01/20/19  Lipid panel  Result Value Ref Range   Cholesterol 171 0 - 200 mg/dL   Triglycerides 74 <150 mg/dL   HDL 66 >40 mg/dL   Total CHOL/HDL Ratio 2.6 RATIO   VLDL 15 0 - 40 mg/dL   LDL Cholesterol 90 0 - 99 mg/dL  Comprehensive metabolic panel  Result Value Ref Range   Sodium 141 135 - 145 mmol/L   Potassium 4.1 3.5 - 5.1 mmol/L   Chloride 108 98 - 111 mmol/L   CO2 24 22 - 32 mmol/L   Glucose, Bld 94 70 - 99 mg/dL   BUN 17 6 - 20 mg/dL   Creatinine, Ser 0.74 0.44 - 1.00 mg/dL   Calcium 8.9 8.9 - 10.3 mg/dL  Total Protein 6.7 6.5 - 8.1 g/dL   Albumin 3.9 3.5 - 5.0 g/dL   AST 35 15 - 41 U/L   ALT 38 0 - 44 U/L   Alkaline Phosphatase 80 38 - 126 U/L   Total Bilirubin 1.3 (H) 0.3 - 1.2 mg/dL   GFR calc non Af Amer >60 >60 mL/min   GFR calc Af Amer >60 >60 mL/min   Anion gap 9 5 - 15      Assessment & Plan:   Encounter Diagnoses  Name Primary?  Marland Kitchen Asthma, chronic, unspecified asthma severity, uncomplicated Yes  . Hyperlipidemia, unspecified hyperlipidemia type   . Gastroesophageal reflux disease, esophagitis presence not specified   . Screening for breast cancer     -Will refer for screening Mammogram -pt to Continue current meds -pt encouraged to continue use of mask when in public -pt to follow up 6 months.  She is to contact office sooner prn

## 2019-01-27 ENCOUNTER — Other Ambulatory Visit: Payer: Self-pay | Admitting: Physician Assistant

## 2019-01-27 DIAGNOSIS — Z1239 Encounter for other screening for malignant neoplasm of breast: Secondary | ICD-10-CM

## 2019-01-30 ENCOUNTER — Other Ambulatory Visit: Payer: Self-pay | Admitting: Physician Assistant

## 2019-01-30 DIAGNOSIS — Z1239 Encounter for other screening for malignant neoplasm of breast: Secondary | ICD-10-CM

## 2019-02-01 IMAGING — MG DIGITAL DIAGNOSTIC BILATERAL MAMMOGRAM WITH TOMO AND CAD
9 series · 9 of 25 positions shown · non-contrast
Comparison: Previous exam(s).

CLINICAL DATA: Left lumpectomy.  Annual mammography.

EXAM:
DIGITAL DIAGNOSTIC BILATERAL MAMMOGRAM WITH CAD AND TOMO

[L CC (1 of 2)]
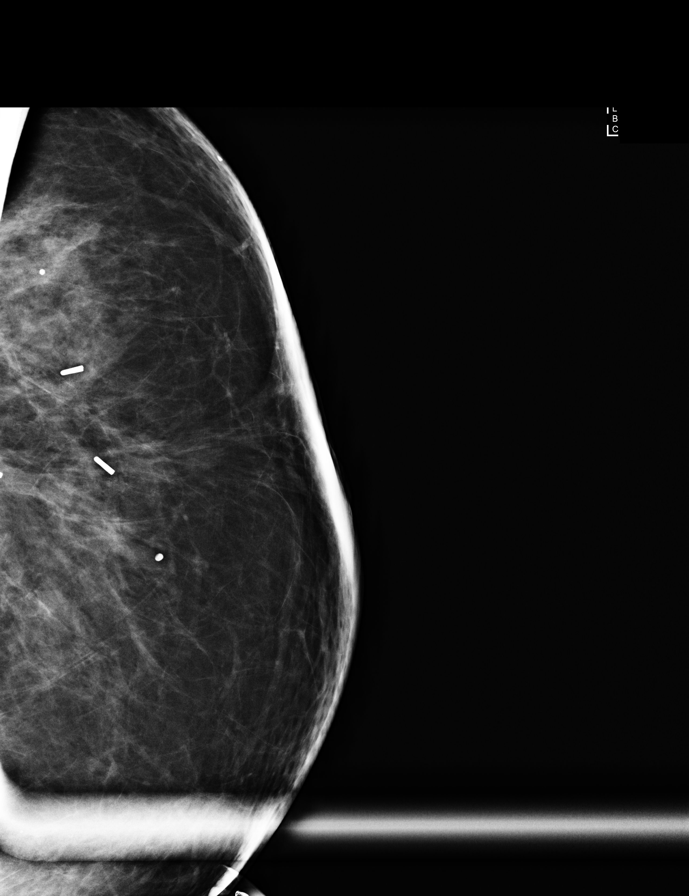

[R MLO]
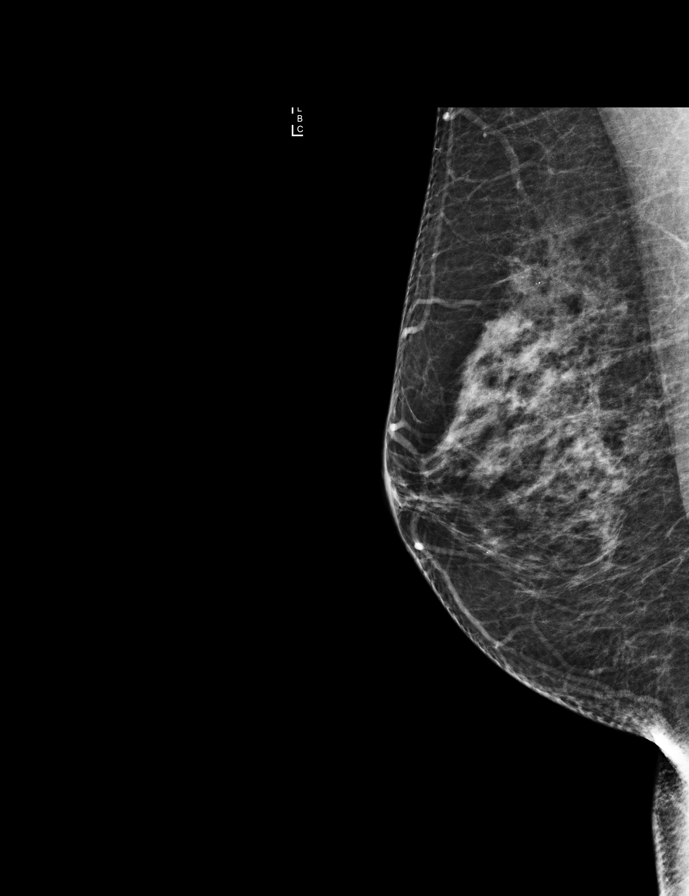

[L CC (2 of 2)]
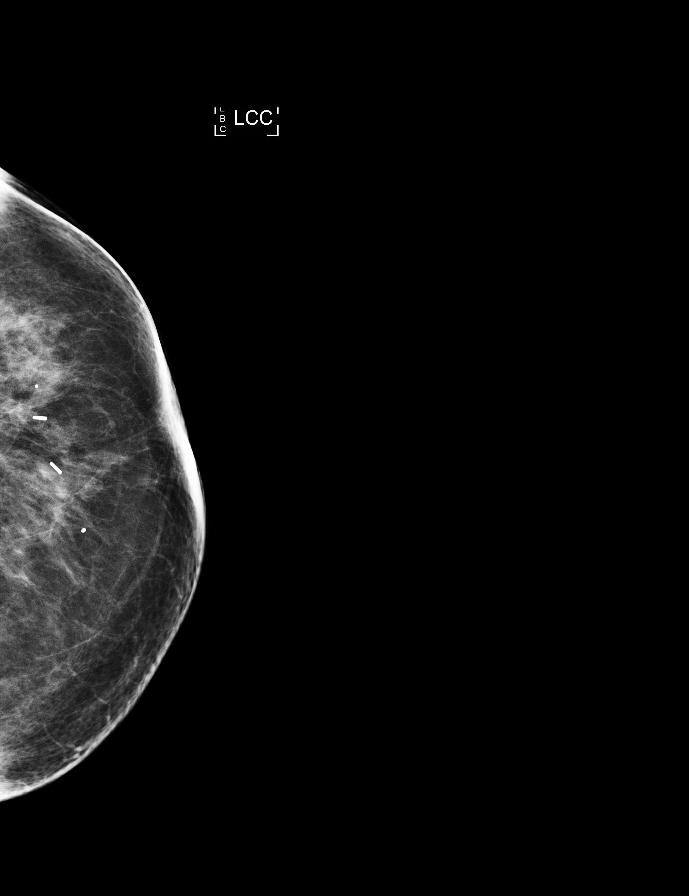

[R CC]
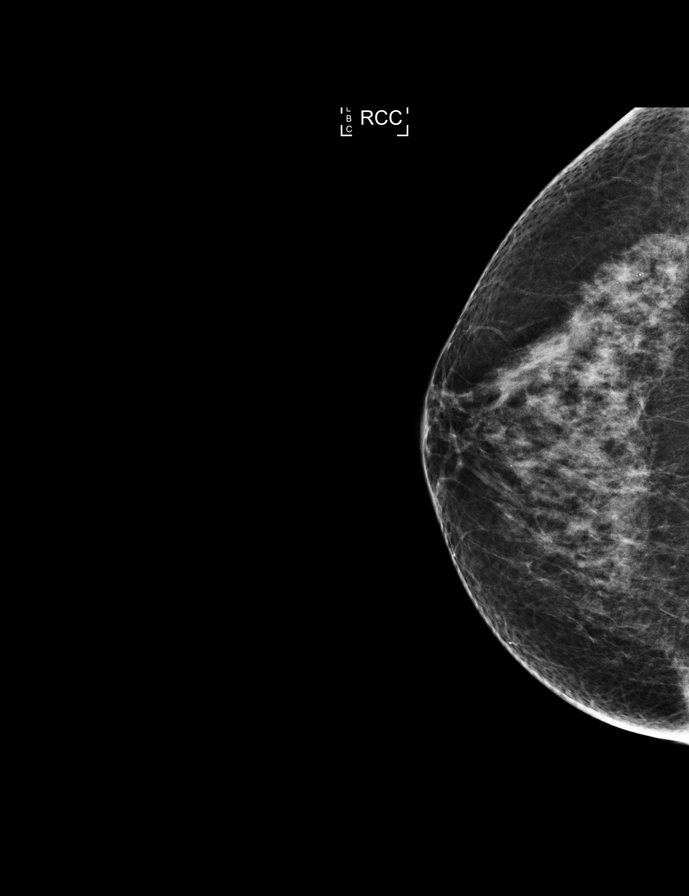

[L MLO]
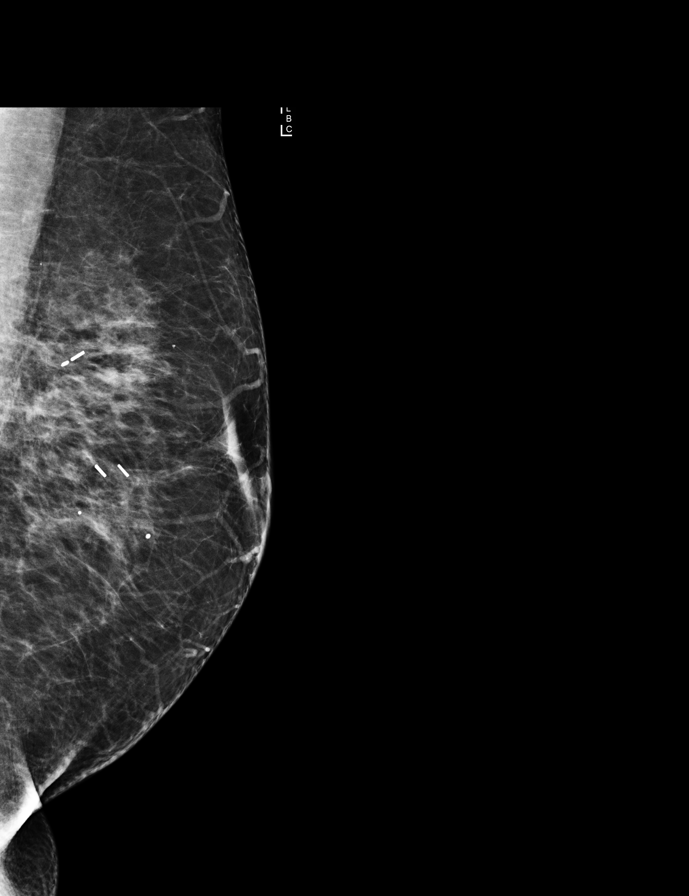

[R MLO tomo · tomo slice 29/58.0]
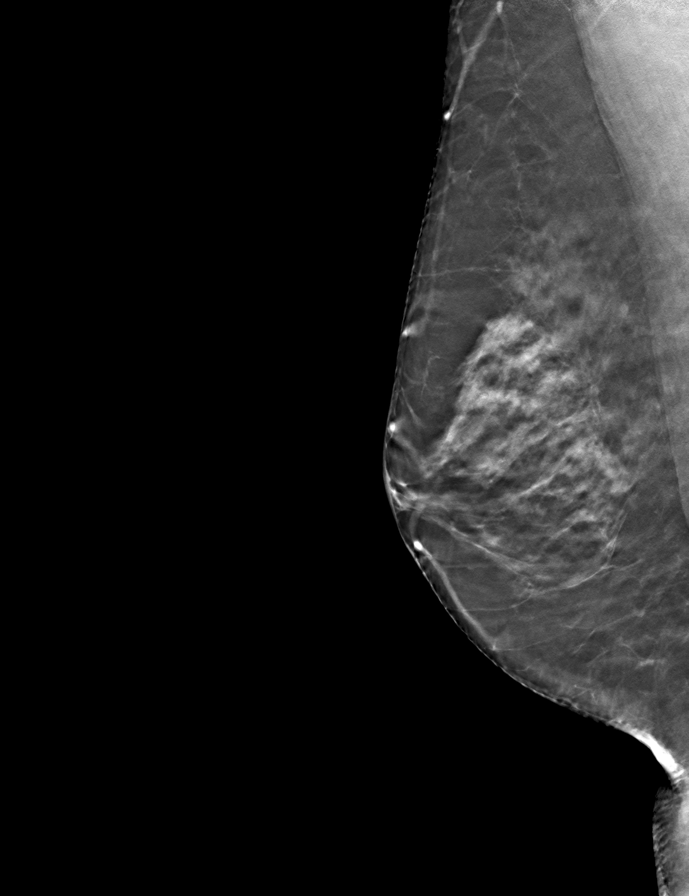

[L MLO tomo · tomo slice 31/61.0]
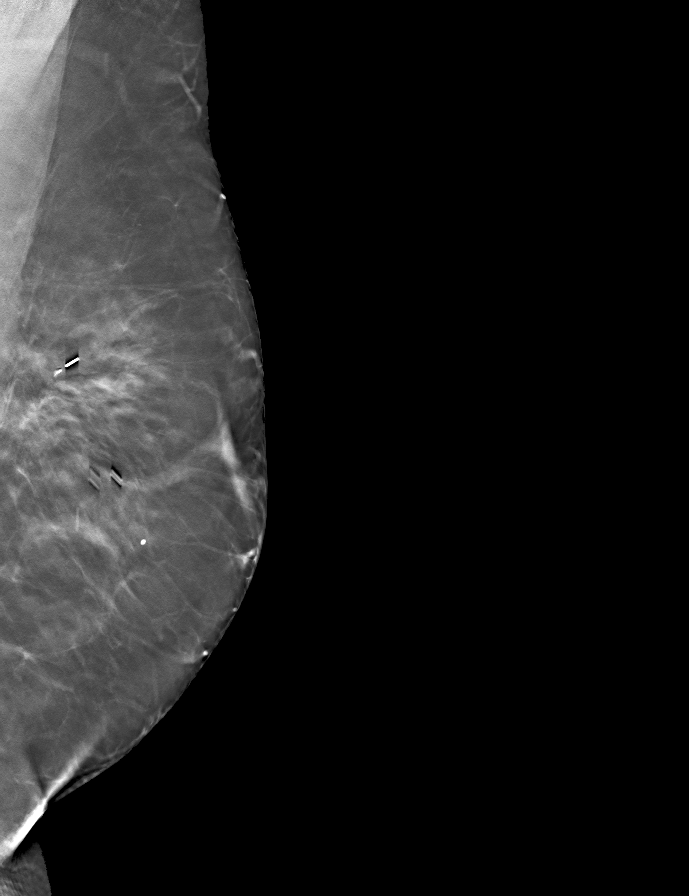

[R CC tomo · tomo slice 31/60.0]
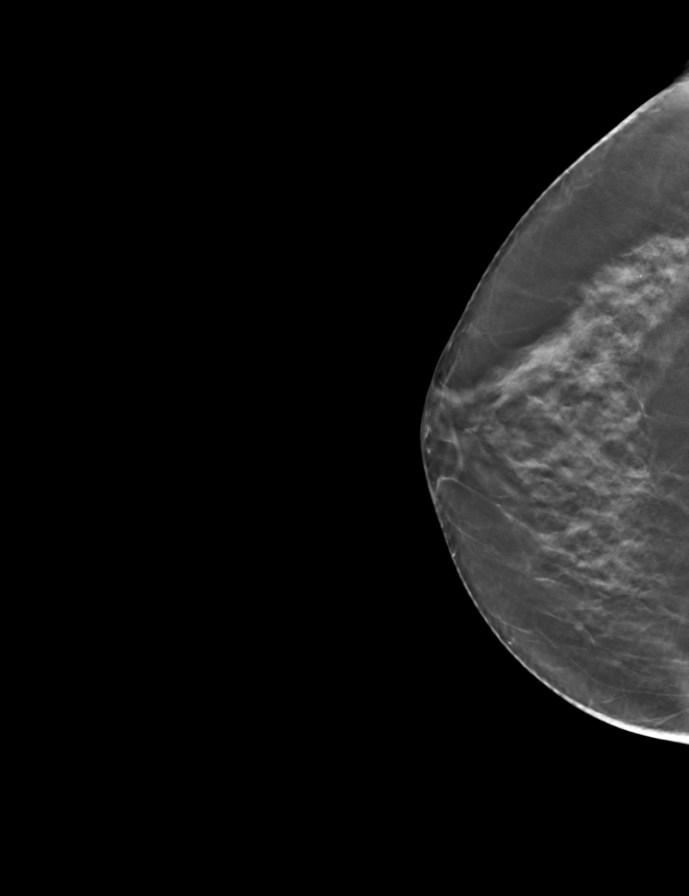

[L CC tomo · tomo slice 34/67.0]
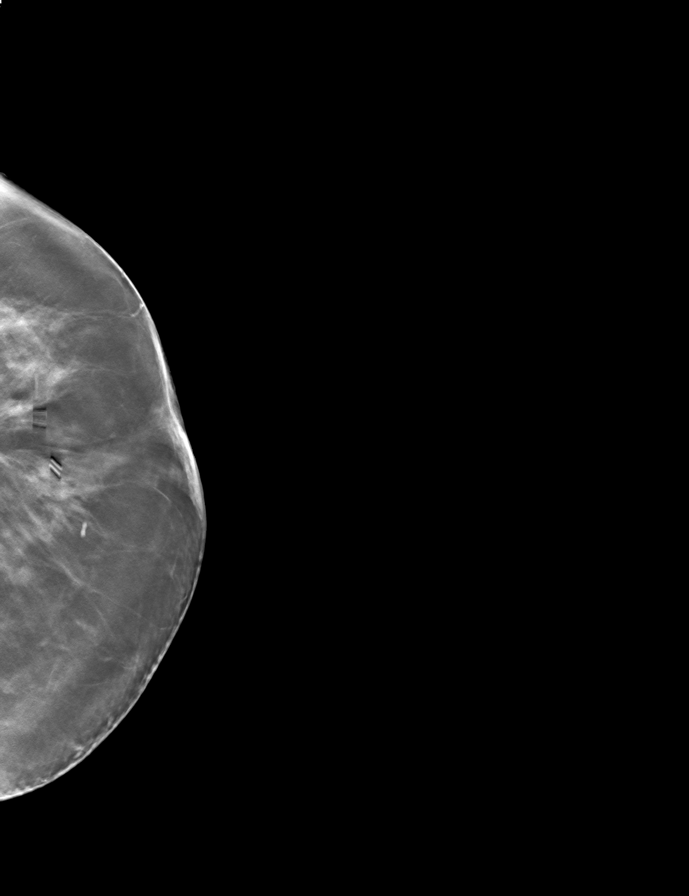

[9 of 25 positions shown; findings below may reference images not displayed]

ACR Breast Density Category b: There are scattered areas of
fibroglandular density.
FINDINGS: No masses, calcifications, or suspicious distortion.

Mammographic images were processed with CAD.
IMPRESSION: No mammographic evidence of malignancy.

RECOMMENDATION:
Annual screening mammography.

I have discussed the findings and recommendations with the patient.
Results were also provided in writing at the conclusion of the
visit. If applicable, a reminder letter will be sent to the patient
regarding the next appointment.

BI-RADS CATEGORY  2: Benign.

## 2019-02-13 ENCOUNTER — Encounter (HOSPITAL_COMMUNITY): Payer: Self-pay

## 2019-02-13 ENCOUNTER — Other Ambulatory Visit: Payer: Self-pay

## 2019-02-13 ENCOUNTER — Ambulatory Visit (HOSPITAL_COMMUNITY)
Admission: RE | Admit: 2019-02-13 | Discharge: 2019-02-13 | Disposition: A | Discharge status: Home / Self Care | Payer: Self-pay | Source: Ambulatory Visit | Attending: Physician Assistant | Admitting: Physician Assistant

## 2019-02-13 DIAGNOSIS — Z1239 Encounter for other screening for malignant neoplasm of breast: Secondary | ICD-10-CM | POA: Insufficient documentation

## 2019-03-04 ENCOUNTER — Encounter (HOSPITAL_COMMUNITY): Payer: Self-pay | Admitting: Emergency Medicine

## 2019-03-04 ENCOUNTER — Other Ambulatory Visit: Payer: Self-pay

## 2019-03-04 ENCOUNTER — Emergency Department (HOSPITAL_COMMUNITY)
Admission: EM | Admit: 2019-03-04 | Discharge: 2019-03-04 | Disposition: A | Discharge status: Home / Self Care | Payer: Self-pay | Attending: Emergency Medicine | Admitting: Emergency Medicine

## 2019-03-04 DIAGNOSIS — L299 Pruritus, unspecified: Secondary | ICD-10-CM | POA: Insufficient documentation

## 2019-03-04 DIAGNOSIS — Z79899 Other long term (current) drug therapy: Secondary | ICD-10-CM | POA: Insufficient documentation

## 2019-03-04 DIAGNOSIS — L509 Urticaria, unspecified: Secondary | ICD-10-CM | POA: Insufficient documentation

## 2019-03-04 DIAGNOSIS — J45909 Unspecified asthma, uncomplicated: Secondary | ICD-10-CM | POA: Insufficient documentation

## 2019-03-04 DIAGNOSIS — W57XXXA Bitten or stung by nonvenomous insect and other nonvenomous arthropods, initial encounter: Secondary | ICD-10-CM

## 2019-03-04 LAB — CBC WITH DIFFERENTIAL/PLATELET
Abs Immature Granulocytes: 0.01 10*3/uL (ref 0.00–0.07)
Basophils Absolute: 0 10*3/uL (ref 0.0–0.1)
Basophils Relative: 0 %
Eosinophils Absolute: 0.7 10*3/uL — ABNORMAL HIGH (ref 0.0–0.5)
Eosinophils Relative: 8 %
HCT: 44.9 % (ref 36.0–46.0)
Hemoglobin: 15 g/dL (ref 12.0–15.0)
Immature Granulocytes: 0 %
Lymphocytes Relative: 8 %
Lymphs Abs: 0.6 10*3/uL — ABNORMAL LOW (ref 0.7–4.0)
MCH: 30.9 pg (ref 26.0–34.0)
MCHC: 33.4 g/dL (ref 30.0–36.0)
MCV: 92.6 fL (ref 80.0–100.0)
Monocytes Absolute: 0.2 10*3/uL (ref 0.1–1.0)
Monocytes Relative: 3 %
Neutro Abs: 6.3 10*3/uL (ref 1.7–7.7)
Neutrophils Relative %: 81 %
Platelets: 215 10*3/uL (ref 150–400)
RBC: 4.85 MIL/uL (ref 3.87–5.11)
RDW: 12.2 % (ref 11.5–15.5)
WBC: 7.9 10*3/uL (ref 4.0–10.5)
nRBC: 0 % (ref 0.0–0.2)

## 2019-03-04 LAB — BASIC METABOLIC PANEL
Anion gap: 11 (ref 5–15)
BUN: 15 mg/dL (ref 6–20)
CO2: 19 mmol/L — ABNORMAL LOW (ref 22–32)
Calcium: 8.6 mg/dL — ABNORMAL LOW (ref 8.9–10.3)
Chloride: 107 mmol/L (ref 98–111)
Creatinine, Ser: 0.96 mg/dL (ref 0.44–1.00)
GFR calc Af Amer: >60 mL/min (ref >60)
GFR calc non Af Amer: >60 mL/min (ref >60)
Glucose, Bld: 174 mg/dL — ABNORMAL HIGH (ref 70–99)
Potassium: 3.5 mmol/L (ref 3.5–5.1)
Sodium: 137 mmol/L (ref 135–145)

## 2019-03-04 MED ORDER — SODIUM CHLORIDE 0.9 % IV BOLUS
1000.0000 mL | Freq: Once | INTRAVENOUS | Status: AC
  Administered 2019-03-04: 1000 mL via INTRAVENOUS

## 2019-03-04 MED ORDER — DIPHENHYDRAMINE HCL 50 MG/ML IJ SOLN
25.0000 mg | Freq: Once | INTRAMUSCULAR | Status: AC
  Administered 2019-03-04: 25 mg via INTRAVENOUS
  Filled 2019-03-04: qty 1

## 2019-03-04 MED ORDER — HYDROXYZINE HCL 25 MG PO TABS
50.0000 mg | ORAL_TABLET | Freq: Once | ORAL | Status: AC
  Administered 2019-03-04: 17:00:00 50 mg via ORAL
  Filled 2019-03-04: qty 2

## 2019-03-04 MED ORDER — FAMOTIDINE IN NACL 20-0.9 MG/50ML-% IV SOLN
20.0000 mg | Freq: Once | INTRAVENOUS | Status: AC
  Administered 2019-03-04: 20 mg via INTRAVENOUS
  Filled 2019-03-04: qty 50

## 2019-03-04 MED ORDER — DEXAMETHASONE SODIUM PHOSPHATE 10 MG/ML IJ SOLN
10.0000 mg | Freq: Once | INTRAMUSCULAR | Status: AC
  Administered 2019-03-04: 16:00:00 10 mg via INTRAVENOUS
  Filled 2019-03-04: qty 1

## 2019-03-04 MED ORDER — HYDROXYZINE HCL 25 MG PO TABS: 25.0000 mg | ORAL_TABLET | Freq: Four times a day (QID) | ORAL | 0 refills | Status: DC

## 2019-03-04 MED ORDER — PREDNISONE 10 MG PO TABS: ORAL_TABLET | ORAL | 0 refills | Status: DC

## 2019-03-04 NOTE — ED Triage Notes (Signed)
Pt was camping and started having a rash on her arms. Now spread to her legs, abdomen and arms. Swelling and redness noted.

## 2019-03-04 NOTE — Discharge Instructions (Signed)
Start the prednisone prescription tomorrow.  You may also apply over-the-counter clear calamine lotion as directed if needed for the itching.  Cool compresses or oatmeal baths may also help.  Follow-up with your primary provider early next week for recheck.  Return to the ER for any worsening symptoms such as fever, swelling or worsening of your current symptoms.

## 2019-03-04 NOTE — ED Provider Notes (Signed)
[facility] EMERGENCY DEPARTMENT Provider Note   CSN: [id number] Arrival date & time: 03/04/19  1423    History   Chief Complaint Chief Complaint  Patient presents with  . Allergic Reaction    HPI Brenda Olsen is a 58 y.o. female with a history of asthma, treated breast cancer, GERD presenting for evaluation of a rash which started on her left upper arm 6 days ago, the day after she was stung by a yellow jacket in her left axilla.  Since the onset of rash, it has spread to now include her trunk, right arm, neck, back and her bilateral ankle and feet (sock line down) and has not responded to the oral and topical benadryl she has been using. The rash has been migratory.   She denies increased sob beyond her normal asthma induced sob, took a dose of her ventolin this am for "reduced air" with improvement.  She just [place] from [place] yesterday.  She describes fatigue, denies fevers or chills, n/v/ abdominal pain and denies mouth, tongue or throat edema or tightness.  She denies prior similar reaction to yellow jacket stings.  Also denies any new exposures to soaps, shampoos, other body products. Has used mosquito spray this week but has used before without reaction.  No tick exposures to her knowledge.      The history is provided by the patient.  Allergic Reaction Presenting symptoms: rash   Presenting symptoms: no difficulty swallowing and no wheezing     Past Medical History:  Diagnosis Date  . Asthma   . Cancer [facility]) 2011   breast  . Hyperlipidemia   . Personal history of radiation therapy 2012    Patient Active Problem List   Diagnosis Date Noted  . History of breast cancer 09/15/2016  . Esophageal reflux 02/11/2016  . Dysphagia 02/11/2016  . Encounter for screening colonoscopy 02/11/2016  . Right foot pain 12/11/2015  . Hyperlipidemia 08/13/2015  . Asthma, chronic 08/13/2015    Past Surgical History:  Procedure Laterality Date  . BREAST  LUMPECTOMY Left 2012  . BREAST SURGERY Left 2012   lumpectomy- followed by radiation x 6 wk  . COLONOSCOPY  age 82  . COLONOSCOPY N/A 02/21/2016   Procedure: COLONOSCOPY;  Surgeon: Danie Binder, MD;  Location: [facility] ENDO SUITE;  Service: Endoscopy;  Laterality: N/A;  1200  . ESOPHAGOGASTRODUODENOSCOPY N/A 02/21/2016   Procedure: ESOPHAGOGASTRODUODENOSCOPY (EGD);  Surgeon: Danie Binder, MD;  Location: [facility] ENDO SUITE;  Service: Endoscopy;  Laterality: N/A;  . KNEE ARTHROSCOPY Bilateral 2001, 2003  . LITHOTRIPSY  2011  . SAVORY DILATION N/A 02/21/2016   Procedure: SAVORY DILATION;  Surgeon: Danie Binder, MD;  Location: [facility] ENDO SUITE;  Service: Endoscopy;  Laterality: N/A;  . THIGH / KNEE SOFT TISSUE BIOPSY Right 58 yo   benign tumor removed L thigh  . TONSILLECTOMY  childhood  . VARICOSE VEIN SURGERY Right 2000     OB History   No obstetric history on file.      Home Medications    Prior to Admission medications   Medication Sig Start Date End Date Taking? Authorizing Provider  albuterol (VENTOLIN HFA) 108 (90 Base) MCG/ACT inhaler Inhale 2 puffs into the lungs every 6 (six) hours as needed for wheezing or shortness of breath. 12/27/18   Soyla Dryer, PA-C  Fluticasone-Salmeterol (ADVAIR) 250-50 MCG/DOSE AEPB Inhale 1 puff into the lungs 2 (two) times daily. 12/27/18   Soyla Dryer, PA-C  omeprazole (PRILOSEC) 20 MG  capsule Take 1 capsule (20 mg total) by mouth daily as needed. 07/21/17   Soyla Dryer, PA-C  simvastatin (ZOCOR) 20 MG tablet Take 1 tablet (20 mg total) by mouth at bedtime. 01/23/19   Soyla Dryer, PA-C    Family History Family History  Problem Relation Age of Onset  . Scleroderma Mother   . Lupus Mother   . Heart disease Mother   . Alzheimer's disease Father   . Heart disease Brother   . Stroke Brother   . Cancer Brother        lung  . Colon cancer Neg Hx     Social History Social History   Tobacco Use  . Smoking status: Never Smoker  . Smokeless  tobacco: Never Used  Substance Use Topics  . Alcohol use: No  . Drug use: No     Allergies   Latex and Sulfa antibiotics   Review of Systems Review of Systems  Constitutional: Negative for fever.  HENT: Negative for congestion, mouth sores, sore throat, trouble swallowing and voice change.   Eyes: Negative.   Respiratory: Negative for chest tightness, shortness of breath, wheezing and stridor.   Cardiovascular: Negative for chest pain.  Gastrointestinal: Negative for abdominal pain, nausea and vomiting.  Genitourinary: Negative.   Musculoskeletal: Negative for arthralgias, joint swelling and neck pain.  Skin: Positive for rash. Negative for wound.  Neurological: Negative for dizziness, weakness, light-headedness, numbness and headaches.  Psychiatric/Behavioral: Negative.      Physical Exam Updated Vital Signs BP 106/63   Pulse (!) 102   Temp 99.5 F (37.5 C) (Oral)   Resp 16   Ht 5\' 2"  (1.575 m)   Wt 61.2 kg   LMP 07/20/2006   SpO2 97%   BMI 24.69 kg/m   Physical Exam Constitutional:      General: She is not in acute distress.    Appearance: Normal appearance. She is well-developed.  HENT:     Head: Normocephalic.  Neck:     Musculoskeletal: Neck supple.  Cardiovascular:     Rate and Rhythm: Normal rate.  Pulmonary:     Effort: Pulmonary effort is normal. No respiratory distress.     Breath sounds: No stridor. No wheezing.  Abdominal:     General: Abdomen is flat. Bowel sounds are normal.  Musculoskeletal: Normal range of motion.        General: No swelling or tenderness.  Skin:    Findings: Rash present. Rash is urticarial and vesicular.     Comments: Round and oval urticarial like lesions bilateral arms, trunk and neck, ankles and feet involving instep.  Palms spared.  Some areas of near confluence (esp right forearm).  Central intact vesicles.   Neurological:     Mental Status: She is alert.      ED Treatments / Results  Labs (all labs ordered  are listed, but only abnormal results are displayed) Labs Reviewed  CBC WITH DIFFERENTIAL/PLATELET - Abnormal; Notable for the following components:      Result Value   Lymphs Abs 0.6 (*)    Eosinophils Absolute 0.7 (*)    All other components within normal limits  BASIC METABOLIC PANEL - Abnormal; Notable for the following components:   CO2 19 (*)    Glucose, Bld 174 (*)    Calcium 8.6 (*)    All other components within normal limits    EKG None  Radiology No results found.  Procedures Procedures (including critical care time)  Medications Ordered in ED  Medications  sodium chloride 0.9 % bolus 1,000 mL (has no administration in time range)  hydrOXYzine (ATARAX/VISTARIL) tablet 50 mg (has no administration in time range)  dexamethasone (DECADRON) injection 10 mg (10 mg Intravenous Given 03/04/19 1549)  diphenhydrAMINE (BENADRYL) injection 25 mg (25 mg Intravenous Given 03/04/19 1549)  famotidine (PEPCID) IVPB 20 mg premix (20 mg Intravenous New Bag/Given 03/04/19 1547)     Initial Impression / Assessment and Plan / ED Course  I have reviewed the triage vital signs and the nursing notes.  Pertinent labs & imaging results that were available during my care of the patient were reviewed by me and considered in my medical decision making (see chart for details).        Pt given IV decadron, benadryl and pepcid.  NS 1 L ordered given tachycardia, soft bp. Discussed this with pt who denies any reasons for dehydration.  She does not feel lightheaded, no sob, cough, cp.  Continued pruritis, hydroxyzine 50 mg PO ordered.  No indication for epinephrine at this time. Pending further observation and hopeful improvement in VS pending IV fluids.   Discussed with Kem Parkinson, PA-C who assumes care.   Final Clinical Impressions(s) / ED Diagnoses   Final diagnoses:  None    ED Discharge Orders    None       Landis Martins 03/04/19 1654    Milton Ferguson, MD 03/07/19  1259

## 2019-03-04 NOTE — ED Provider Notes (Signed)
   Pt signed out to me by Evalee Jefferson, PA-C at end of shift.    Pt here secondary to a bee sting that occurred 6 days ago.  Pt has also been camping during this time and developed extreme itching and raised erythematous welts to her trunk, arms and dorsal surfaces of her hands and feet.  She was given IV medications here and reports feeling better.  No airway compromise, facial edema, or swelling of her lips or tongue.  She has been observed in the emergency department for extended period and without complications.  No concerning symptoms for anaphylaxis  1835 on recheck, patient reports that she is feeling better and she is ready for discharge home.  Vitals also improved. Patient is also been seen by Dr. Roderic Palau and care plan discussed.  Laboratory studies are reassuring.  Patient agrees to prescription prednisone taper and Vistaril for itching.  I have also advised close follow-up with PCP for early next week and given strict return precautions.   Kem Parkinson, PA-C 03/04/19 1859    Milton Ferguson, MD 03/07/19 1259

## 2019-07-25 ENCOUNTER — Ambulatory Visit: Payer: Self-pay | Admitting: Physician Assistant

## 2019-07-31 ENCOUNTER — Other Ambulatory Visit: Payer: Self-pay

## 2019-07-31 ENCOUNTER — Other Ambulatory Visit (HOSPITAL_COMMUNITY)
Admission: RE | Admit: 2019-07-31 | Discharge: 2019-07-31 | Disposition: A | Discharge status: Home / Self Care | Payer: Self-pay | Source: Ambulatory Visit | Attending: Physician Assistant | Admitting: Physician Assistant

## 2019-07-31 DIAGNOSIS — E785 Hyperlipidemia, unspecified: Secondary | ICD-10-CM | POA: Insufficient documentation

## 2019-07-31 LAB — LIPID PANEL
Cholesterol: 164 mg/dL (ref 0–200)
HDL: 58 mg/dL (ref >40)
LDL Cholesterol: 88 mg/dL (ref 0–99)
Total CHOL/HDL Ratio: 2.8 RATIO
Triglycerides: 92 mg/dL (ref <150)
VLDL: 18 mg/dL (ref 0–40)

## 2019-07-31 LAB — COMPREHENSIVE METABOLIC PANEL
ALT: 34 U/L (ref 0–44)
AST: 27 U/L (ref 15–41)
Albumin: 3.9 g/dL (ref 3.5–5.0)
Alkaline Phosphatase: 76 U/L (ref 38–126)
Anion gap: 9 (ref 5–15)
BUN: 16 mg/dL (ref 6–20)
CO2: 24 mmol/L (ref 22–32)
Calcium: 9.3 mg/dL (ref 8.9–10.3)
Chloride: 108 mmol/L (ref 98–111)
Creatinine, Ser: 0.77 mg/dL (ref 0.44–1.00)
GFR calc Af Amer: >60 mL/min (ref >60)
GFR calc non Af Amer: >60 mL/min (ref >60)
Glucose, Bld: 100 mg/dL — ABNORMAL HIGH (ref 70–99)
Potassium: 4.3 mmol/L (ref 3.5–5.1)
Sodium: 141 mmol/L (ref 135–145)
Total Bilirubin: 1.5 mg/dL — ABNORMAL HIGH (ref 0.3–1.2)
Total Protein: 6.9 g/dL (ref 6.5–8.1)

## 2019-08-01 ENCOUNTER — Ambulatory Visit: Payer: Self-pay | Admitting: Physician Assistant

## 2019-08-01 ENCOUNTER — Encounter: Payer: Self-pay | Admitting: Physician Assistant

## 2019-08-01 DIAGNOSIS — J45909 Unspecified asthma, uncomplicated: Secondary | ICD-10-CM

## 2019-08-01 DIAGNOSIS — E785 Hyperlipidemia, unspecified: Secondary | ICD-10-CM

## 2019-08-01 MED ORDER — SIMVASTATIN 20 MG PO TABS: 20.0000 mg | ORAL_TABLET | Freq: Every day | ORAL | 1 refills | Status: DC

## 2019-08-01 NOTE — Progress Notes (Signed)
LMP 07/20/2006    Subjective:    Patient ID: Brenda Olsen, female    DOB: 1960-10-13, 59 y.o.   MRN: [id number]  HPI: Brenda Olsen is a 59 y.o. female presenting on 08/01/2019 for No chief complaint on file.   HPI   This is a telemedicine appointment due to coronavirus pandemic.  It is via Telephone as pt does not have video enabled device  I connected with  Naomee Condon on 08/01/19 by a video enabled telemedicine application and verified that I am speaking with the correct person using two identifiers.   I discussed the limitations of evaluation and management by telemedicine. The patient expressed understanding and agreed to proceed.  Pt is at home.  provider is at office.    Pt is 65yoF with asthma, dyslipidemia and GERD.  Currently she is Not working.  She says she is doing okay.  She says she is A little congested currently.    She wears mask when out.   She is trying to limit her exposure to others.     She has her 48yo mother living with her.    She exercises/walks the dog regularly.     She has no complaints today.    Relevant past medical, surgical, family and social history reviewed and updated as indicated. Interim medical history since our last visit reviewed. Allergies and medications reviewed and updated.   Current Outpatient Medications:  .  albuterol (VENTOLIN HFA) 108 (90 Base) MCG/ACT inhaler, Inhale 2 puffs into the lungs every 6 (six) hours as needed for wheezing or shortness of breath., Disp: 3 Inhaler, Rfl: 3 .  diphenhydrAMINE (BENADRYL) 25 MG tablet, Take 25 mg by mouth every 6 (six) hours as needed for itching or allergies., Disp: , Rfl:  .  Fluticasone-Salmeterol (ADVAIR) 250-50 MCG/DOSE AEPB, Inhale 1 puff into the lungs 2 (two) times daily., Disp: 180 each, Rfl: 3 .  omeprazole (PRILOSEC) 20 MG capsule, Take 1 capsule (20 mg total) by mouth daily as needed. (Patient taking differently: Take 20 mg by mouth daily as needed (for acid reflux). ), Disp:  90 capsule, Rfl: 1 .  simvastatin (ZOCOR) 20 MG tablet, Take 1 tablet (20 mg total) by mouth at bedtime., Disp: 90 tablet, Rfl: 1    Review of Systems  Per HPI unless specifically indicated above     Objective:    LMP 07/20/2006   Wt Readings from Last 3 Encounters:  03/04/19 135 lb (61.2 kg)  03/08/18 142 lb 8 oz (64.6 kg)  11/16/17 143 lb 8 oz (65.1 kg)    Physical Exam Constitutional:      General: She is not in acute distress. Pulmonary:     Effort: Pulmonary effort is normal. No respiratory distress.  Neurological:     Mental Status: She is alert and oriented to person, place, and time.  Psychiatric:        Attention and Perception: Attention normal.        Mood and Affect: Mood normal.        Speech: Speech normal.        Behavior: Behavior is cooperative.     Results for orders placed or performed during the hospital encounter of 07/31/19  Lipid panel  Result Value Ref Range   Cholesterol 164 0 - 200 mg/dL   Triglycerides 92 <150 mg/dL   HDL 58 >40 mg/dL   Total CHOL/HDL Ratio 2.8 RATIO   VLDL 18 0 - 40 mg/dL   LDL Cholesterol  88 0 - 99 mg/dL  Comprehensive metabolic panel  Result Value Ref Range   Sodium 141 135 - 145 mmol/L   Potassium 4.3 3.5 - 5.1 mmol/L   Chloride 108 98 - 111 mmol/L   CO2 24 22 - 32 mmol/L   Glucose, Bld 100 (H) 70 - 99 mg/dL   BUN 16 6 - 20 mg/dL   Creatinine, Ser 0.77 0.44 - 1.00 mg/dL   Calcium 9.3 8.9 - 10.3 mg/dL   Total Protein 6.9 6.5 - 8.1 g/dL   Albumin 3.9 3.5 - 5.0 g/dL   AST 27 15 - 41 U/L   ALT 34 0 - 44 U/L   Alkaline Phosphatase 76 38 - 126 U/L   Total Bilirubin 1.5 (H) 0.3 - 1.2 mg/dL   GFR calc non Af Amer >60 >60 mL/min   GFR calc Af Amer >60 >60 mL/min   Anion gap 9 5 - 15      Assessment & Plan:    Encounter Diagnoses  Name Primary?  Marland Kitchen Asthma, chronic, unspecified asthma severity, uncomplicated Yes  . Hyperlipidemia, unspecified hyperlipidemia type      -Reviewed labs with pt -pt to continue  current medications -pt to follow up in 6 months-   In office.  She is to contact office sooner prn

## 2019-10-06 ENCOUNTER — Ambulatory Visit: Payer: Self-pay | Attending: Internal Medicine

## 2019-10-06 DIAGNOSIS — Z23 Encounter for immunization: Secondary | ICD-10-CM

## 2019-10-06 NOTE — Progress Notes (Signed)
   Covid-19 Vaccination Clinic  Name:  Brenda Olsen    MRN: [id number] DOB: [birth year]  10/06/2019  [name] was observed post Covid-19 immunization for 15 minutes without incident. She was provided with Vaccine Information Sheet and instruction to access the V-Safe system.   [name] was instructed to call 911 with any severe reactions post vaccine: Marland Kitchen Difficulty breathing  . Swelling of face and throat  . A fast heartbeat  . A bad rash all over body  . Dizziness and weakness   Immunizations Administered    Name Date Dose VIS Date Route   Moderna COVID-19 Vaccine 10/06/2019  8:31 AM 0.5 mL 06/20/2019 Intramuscular   Manufacturer: Moderna   Lot: [id number]   [id number]

## 2019-11-07 ENCOUNTER — Ambulatory Visit: Payer: Self-pay | Attending: Internal Medicine

## 2019-11-07 DIAGNOSIS — Z23 Encounter for immunization: Secondary | ICD-10-CM

## 2019-11-07 NOTE — Progress Notes (Signed)
   Covid-19 Vaccination Clinic  Name:  Brenda Olsen    MRN: [id number] DOB: Apr 15, 1961  11/07/2019  [name] was observed post Covid-19 immunization for 15 minutes without incident. She was provided with Vaccine Information Sheet and instruction to access the V-Safe system.   [name] was instructed to call 911 with any severe reactions post vaccine: Marland Kitchen Difficulty breathing  . Swelling of face and throat  . A fast heartbeat  . A bad rash all over body  . Dizziness and weakness   Immunizations Administered    Name Date Dose VIS Date Route   Moderna COVID-19 Vaccine 11/07/2019  8:02 AM 0.5 mL 06/2019 Intramuscular   Manufacturer: Moderna   Lot: [id number]   [id number]

## 2020-01-02 ENCOUNTER — Other Ambulatory Visit: Payer: Self-pay | Admitting: Physician Assistant

## 2020-01-02 MED ORDER — FLUTICASONE-SALMETEROL 250-50 MCG/DOSE IN AEPB: 1.0000 | INHALATION_SPRAY | Freq: Two times a day (BID) | RESPIRATORY_TRACT | 3 refills | Status: DC

## 2020-01-23 ENCOUNTER — Ambulatory Visit: Payer: Self-pay | Admitting: Physician Assistant

## 2020-01-23 ENCOUNTER — Other Ambulatory Visit (HOSPITAL_COMMUNITY)
Admission: RE | Admit: 2020-01-23 | Discharge: 2020-01-23 | Disposition: A | Discharge status: Home / Self Care | Payer: Self-pay | Source: Ambulatory Visit | Attending: Physician Assistant | Admitting: Physician Assistant

## 2020-01-23 ENCOUNTER — Other Ambulatory Visit: Payer: Self-pay

## 2020-01-23 DIAGNOSIS — E785 Hyperlipidemia, unspecified: Secondary | ICD-10-CM | POA: Insufficient documentation

## 2020-01-23 LAB — COMPREHENSIVE METABOLIC PANEL
ALT: 28 U/L (ref 0–44)
AST: 25 U/L (ref 15–41)
Albumin: 3.9 g/dL (ref 3.5–5.0)
Alkaline Phosphatase: 81 U/L (ref 38–126)
Anion gap: 9 (ref 5–15)
BUN: 20 mg/dL (ref 8–23)
CO2: 26 mmol/L (ref 22–32)
Calcium: 9 mg/dL (ref 8.9–10.3)
Chloride: 106 mmol/L (ref 98–111)
Creatinine, Ser: 0.88 mg/dL (ref 0.44–1.00)
GFR calc Af Amer: >60 mL/min (ref >60)
GFR calc non Af Amer: >60 mL/min (ref >60)
Glucose, Bld: 98 mg/dL (ref 70–99)
Potassium: 4 mmol/L (ref 3.5–5.1)
Sodium: 141 mmol/L (ref 135–145)
Total Bilirubin: 1.8 mg/dL — ABNORMAL HIGH (ref 0.3–1.2)
Total Protein: 6.7 g/dL (ref 6.5–8.1)

## 2020-01-23 LAB — LIPID PANEL
Cholesterol: 194 mg/dL (ref 0–200)
HDL: 65 mg/dL (ref >40)
LDL Cholesterol: 117 mg/dL — ABNORMAL HIGH (ref 0–99)
Total CHOL/HDL Ratio: 3 RATIO
Triglycerides: 60 mg/dL (ref <150)
VLDL: 12 mg/dL (ref 0–40)

## 2020-01-30 ENCOUNTER — Ambulatory Visit: Payer: Self-pay | Admitting: Physician Assistant

## 2020-01-30 ENCOUNTER — Encounter: Payer: Self-pay | Admitting: Physician Assistant

## 2020-01-30 ENCOUNTER — Other Ambulatory Visit: Payer: Self-pay

## 2020-01-30 VITALS — BP 110/70 | HR 104 | Temp 99.0°F | Ht 61.75 in | Wt 146.0 lb

## 2020-01-30 DIAGNOSIS — J45909 Unspecified asthma, uncomplicated: Secondary | ICD-10-CM

## 2020-01-30 DIAGNOSIS — Z1239 Encounter for other screening for malignant neoplasm of breast: Secondary | ICD-10-CM

## 2020-01-30 DIAGNOSIS — E785 Hyperlipidemia, unspecified: Secondary | ICD-10-CM

## 2020-01-30 NOTE — Progress Notes (Signed)
BP 110/70    Pulse (!) 104    Temp 99 F (37.2 C)    Ht 5' 1.75" (1.568 m)    Wt 146 lb (66.2 kg)    LMP 07/20/2006    SpO2 97%    BMI 26.92 kg/m    Subjective:    Patient ID: Brenda Olsen, female    DOB: 28-Jan-1961, 59 y.o.   MRN: [id number]  HPI: Brenda Olsen is a 59 y.o. female presenting on 01/30/2020 for Hyperlipidemia and Asthma   HPI     Pt had a negative covid 19 screening questionnaire.    Pt is 21yoF with dyslipidemia and asthma.  She is having some seasonal breathing issues.   her Inhaler is working well  Pt got her covid vaccination  She has no other complaints.     Relevant past medical, surgical, family and social history reviewed and updated as indicated. Interim medical history since our last visit reviewed. Allergies and medications reviewed and updated.   Current Outpatient Medications:    albuterol (VENTOLIN HFA) 108 (90 Base) MCG/ACT inhaler, Inhale 2 puffs into the lungs every 6 (six) hours as needed for wheezing or shortness of breath., Disp: 3 Inhaler, Rfl: 3   diphenhydrAMINE (BENADRYL) 25 MG tablet, Take 25 mg by mouth every 6 (six) hours as needed for itching or allergies., Disp: , Rfl:    Fluticasone-Salmeterol (ADVAIR) 250-50 MCG/DOSE AEPB, Inhale 1 puff into the lungs 2 (two) times daily., Disp: 180 each, Rfl: 3   omeprazole (PRILOSEC) 20 MG capsule, Take 1 capsule (20 mg total) by mouth daily as needed. (Patient taking differently: Take 20 mg by mouth daily as needed (for acid reflux). ), Disp: 90 capsule, Rfl: 1   simvastatin (ZOCOR) 20 MG tablet, Take 1 tablet (20 mg total) by mouth at bedtime., Disp: 90 tablet, Rfl: 1    Review of Systems  Per HPI unless specifically indicated above     Objective:    BP 110/70    Pulse (!) 104    Temp 99 F (37.2 C)    Ht 5' 1.75" (1.568 m)    Wt 146 lb (66.2 kg)    LMP 07/20/2006    SpO2 97%    BMI 26.92 kg/m   Wt Readings from Last 3 Encounters:  01/30/20 146 lb (66.2 kg)  03/04/19 135 lb  (61.2 kg)  03/08/18 142 lb 8 oz (64.6 kg)    Physical Exam Vitals reviewed.  Constitutional:      General: She is not in acute distress.    Appearance: She is well-developed. She is not toxic-appearing.  HENT:     Head: Normocephalic and atraumatic.  Cardiovascular:     Rate and Rhythm: Normal rate and regular rhythm.  Pulmonary:     Effort: Pulmonary effort is normal.     Breath sounds: Normal breath sounds.  Abdominal:     General: Bowel sounds are normal.     Palpations: Abdomen is soft. There is no mass.     Tenderness: There is no abdominal tenderness.  Musculoskeletal:     Cervical back: Neck supple.     Right lower leg: No edema.     Left lower leg: No edema.  Lymphadenopathy:     Cervical: No cervical adenopathy.  Skin:    General: Skin is warm and dry.  Neurological:     Mental Status: She is alert and oriented to person, place, and time.  Psychiatric:  Attention and Perception: Attention normal.        Behavior: Behavior normal. Behavior is cooperative.     Comments: Pleasant and having normal conversation.     Results for orders placed or performed during the hospital encounter of 01/23/20  Lipid panel  Result Value Ref Range   Cholesterol 194 0 - 200 mg/dL   Triglycerides 60 <150 mg/dL   HDL 65 >40 mg/dL   Total CHOL/HDL Ratio 3.0 RATIO   VLDL 12 0 - 40 mg/dL   LDL Cholesterol 117 (H) 0 - 99 mg/dL  Comprehensive metabolic panel  Result Value Ref Range   Sodium 141 135 - 145 mmol/L   Potassium 4.0 3.5 - 5.1 mmol/L   Chloride 106 98 - 111 mmol/L   CO2 26 22 - 32 mmol/L   Glucose, Bld 98 70 - 99 mg/dL   BUN 20 8 - 23 mg/dL   Creatinine, Ser 0.88 0.44 - 1.00 mg/dL   Calcium 9.0 8.9 - 10.3 mg/dL   Total Protein 6.7 6.5 - 8.1 g/dL   Albumin 3.9 3.5 - 5.0 g/dL   AST 25 15 - 41 U/L   ALT 28 0 - 44 U/L   Alkaline Phosphatase 81 38 - 126 U/L   Total Bilirubin 1.8 (H) 0.3 - 1.2 mg/dL   GFR calc non Af Amer >60 >60 mL/min   GFR calc Af Amer >60 >60  mL/min   Anion gap 9 5 - 15      Assessment & Plan:    Encounter Diagnoses  Name Primary?   Asthma, chronic, unspecified asthma severity, uncomplicated Yes   Hyperlipidemia, unspecified hyperlipidemia type    Encounter for screening for malignant neoplasm of breast, unspecified screening modality      -reviewed labs with pt -pt counseled to [place] -she will Continue current meds -will refer for screening Mammogram  -Colonoscopy UTD - done 2017 -pt to follow up 6 months.  She is to contact office sooner prn

## 2020-02-08 ENCOUNTER — Other Ambulatory Visit: Payer: Self-pay | Admitting: Student

## 2020-02-08 DIAGNOSIS — Z1239 Encounter for other screening for malignant neoplasm of breast: Secondary | ICD-10-CM

## 2020-02-21 ENCOUNTER — Ambulatory Visit (HOSPITAL_COMMUNITY): Payer: Self-pay

## 2020-02-26 ENCOUNTER — Other Ambulatory Visit: Payer: Self-pay

## 2020-02-26 ENCOUNTER — Ambulatory Visit (HOSPITAL_COMMUNITY)
Admission: RE | Admit: 2020-02-26 | Discharge: 2020-02-26 | Disposition: A | Discharge status: Home / Self Care | Payer: Self-pay | Source: Ambulatory Visit | Attending: Physician Assistant | Admitting: Physician Assistant

## 2020-02-26 DIAGNOSIS — Z1239 Encounter for other screening for malignant neoplasm of breast: Secondary | ICD-10-CM | POA: Insufficient documentation

## 2020-03-06 ENCOUNTER — Ambulatory Visit: Payer: Self-pay | Admitting: Physician Assistant

## 2020-03-07 ENCOUNTER — Other Ambulatory Visit: Payer: Self-pay

## 2020-03-07 ENCOUNTER — Encounter: Payer: Self-pay | Admitting: Physician Assistant

## 2020-03-07 ENCOUNTER — Ambulatory Visit: Payer: Self-pay | Admitting: Physician Assistant

## 2020-03-07 VITALS — BP 108/64 | HR 102 | Temp 98.1°F

## 2020-03-07 DIAGNOSIS — T63441A Toxic effect of venom of bees, accidental (unintentional), initial encounter: Secondary | ICD-10-CM

## 2020-03-07 MED ORDER — SIMVASTATIN 20 MG PO TABS: 20.0000 mg | ORAL_TABLET | Freq: Every day | ORAL | 1 refills | Status: DC

## 2020-03-07 NOTE — Progress Notes (Signed)
   BP 108/64   Pulse (!) 102   Temp 98.1 F (36.7 C)   LMP 07/20/2006   SpO2 97%    Subjective:    Patient ID: Brenda Olsen, female    DOB: [birth year], 59 y.o.   MRN: [id number]  HPI: Brenda Olsen is a 59 y.o. female presenting on 03/07/2020 for No chief complaint on file.   HPI   Pt had a negative covid 19 screening questionnaire.    Pt got bee sting Tuesday.   She says it 'welted up" and she took some benedryl which helped.   She had no SOB or reaction anywhere except on the L arm.       Relevant past medical, surgical, family and social history reviewed and updated as indicated. Interim medical history since our last visit reviewed. Allergies and medications reviewed and updated.   Current Outpatient Medications:  .  albuterol (VENTOLIN HFA) 108 (90 Base) MCG/ACT inhaler, Inhale 2 puffs into the lungs every 6 (six) hours as needed for wheezing or shortness of breath., Disp: 3 Inhaler, Rfl: 3 .  diphenhydrAMINE (BENADRYL) 25 MG tablet, Take 25 mg by mouth every 6 (six) hours as needed for itching or allergies., Disp: , Rfl:  .  Fluticasone-Salmeterol (ADVAIR) 250-50 MCG/DOSE AEPB, Inhale 1 puff into the lungs 2 (two) times daily., Disp: 180 each, Rfl: 3 .  omeprazole (PRILOSEC) 20 MG capsule, Take 1 capsule (20 mg total) by mouth daily as needed. (Patient taking differently: Take 20 mg by mouth daily as needed (for acid reflux). ), Disp: 90 capsule, Rfl: 1 .  simvastatin (ZOCOR) 20 MG tablet, Take 1 tablet (20 mg total) by mouth at bedtime., Disp: 90 tablet, Rfl: 1    Review of Systems  Per HPI unless specifically indicated above     Objective:    BP 108/64   Pulse (!) 102   Temp 98.1 F (36.7 C)   LMP 07/20/2006   SpO2 97%   Wt Readings from Last 3 Encounters:  01/30/20 146 lb (66.2 kg)  03/04/19 135 lb (61.2 kg)  03/08/18 142 lb 8 oz (64.6 kg)    Physical Exam Constitutional:      General: She is not in acute distress.    Appearance: She is not  ill-appearing.  HENT:     Head: Normocephalic and atraumatic.  Cardiovascular:     Rate and Rhythm: Normal rate and regular rhythm.  Pulmonary:     Effort: Pulmonary effort is normal. No respiratory distress.     Breath sounds: Normal breath sounds. No stridor. No wheezing or rhonchi.  Musculoskeletal:     Cervical back: Neck supple.     Comments: Pt with tiny scab L elbow at sting site.  There is no visible rash or swelling at this time.  The elbow is with FROM.  Skin:    General: Skin is warm and dry.  Neurological:     Mental Status: She is alert and oriented to person, place, and time.  Psychiatric:        Behavior: Behavior normal.            Assessment & Plan:    Encounter Diagnosis  Name Primary?  . Bee sting, accidental or unintentional, initial encounter Yes    -discussed and counseled with pt local reaction and signs/symptoms of systemic reaction including SOB, swelling of face, throat, wheezing, etc.  She states understanding -benedryl prn -Follow up January as scheduled

## 2020-04-25 ENCOUNTER — Other Ambulatory Visit: Payer: Self-pay | Admitting: Physician Assistant

## 2020-04-25 MED ORDER — ALBUTEROL SULFATE HFA 108 (90 BASE) MCG/ACT IN AERS: 2.0000 | INHALATION_SPRAY | Freq: Four times a day (QID) | RESPIRATORY_TRACT | 3 refills | Status: DC | PRN

## 2020-05-21 ENCOUNTER — Other Ambulatory Visit: Payer: Self-pay | Admitting: Physician Assistant

## 2020-05-21 MED ORDER — FLUTICASONE-SALMETEROL 250-50 MCG/DOSE IN AEPB: 1.0000 | INHALATION_SPRAY | Freq: Two times a day (BID) | RESPIRATORY_TRACT | 3 refills | Status: DC

## 2020-05-21 MED ORDER — ALBUTEROL SULFATE HFA 108 (90 BASE) MCG/ACT IN AERS: 2.0000 | INHALATION_SPRAY | Freq: Four times a day (QID) | RESPIRATORY_TRACT | 3 refills | Status: DC | PRN

## 2020-06-20 ENCOUNTER — Ambulatory Visit: Payer: Self-pay

## 2020-08-06 ENCOUNTER — Ambulatory Visit: Payer: Self-pay | Admitting: Physician Assistant

## 2020-08-07 ENCOUNTER — Other Ambulatory Visit: Payer: Self-pay

## 2020-08-07 ENCOUNTER — Other Ambulatory Visit (HOSPITAL_COMMUNITY)
Admission: RE | Admit: 2020-08-07 | Discharge: 2020-08-07 | Disposition: A | Discharge status: Home / Self Care | Payer: Self-pay | Source: Ambulatory Visit | Attending: Physician Assistant | Admitting: Physician Assistant

## 2020-08-07 DIAGNOSIS — E785 Hyperlipidemia, unspecified: Secondary | ICD-10-CM | POA: Insufficient documentation

## 2020-08-07 LAB — COMPREHENSIVE METABOLIC PANEL
ALT: 37 U/L (ref 0–44)
AST: 33 U/L (ref 15–41)
Albumin: 3.8 g/dL (ref 3.5–5.0)
Alkaline Phosphatase: 73 U/L (ref 38–126)
Anion gap: 9 (ref 5–15)
BUN: 15 mg/dL (ref 8–23)
CO2: 24 mmol/L (ref 22–32)
Calcium: 8.9 mg/dL (ref 8.9–10.3)
Chloride: 105 mmol/L (ref 98–111)
Creatinine, Ser: 0.86 mg/dL (ref 0.44–1.00)
GFR, Estimated: >60 mL/min (ref >60)
Glucose, Bld: 101 mg/dL — ABNORMAL HIGH (ref 70–99)
Potassium: 4 mmol/L (ref 3.5–5.1)
Sodium: 138 mmol/L (ref 135–145)
Total Bilirubin: 1.5 mg/dL — ABNORMAL HIGH (ref 0.3–1.2)
Total Protein: 6.5 g/dL (ref 6.5–8.1)

## 2020-08-07 LAB — LIPID PANEL
Cholesterol: 154 mg/dL (ref 0–200)
HDL: 62 mg/dL (ref >40)
LDL Cholesterol: 82 mg/dL (ref 0–99)
Total CHOL/HDL Ratio: 2.5 RATIO
Triglycerides: 49 mg/dL (ref <150)
VLDL: 10 mg/dL (ref 0–40)

## 2020-08-08 ENCOUNTER — Ambulatory Visit: Payer: Self-pay | Admitting: Physician Assistant

## 2020-08-08 ENCOUNTER — Encounter: Payer: Self-pay | Admitting: Physician Assistant

## 2020-08-08 DIAGNOSIS — J45909 Unspecified asthma, uncomplicated: Secondary | ICD-10-CM

## 2020-08-08 DIAGNOSIS — E785 Hyperlipidemia, unspecified: Secondary | ICD-10-CM

## 2020-08-08 MED ORDER — SIMVASTATIN 20 MG PO TABS: 20.0000 mg | ORAL_TABLET | Freq: Every day | ORAL | 1 refills | Status: DC

## 2020-08-08 NOTE — Progress Notes (Signed)
LMP 07/20/2006    Subjective:    Patient ID: Brenda Olsen, female    DOB: [birth year], 60 y.o.   MRN: [id number]  HPI: Brenda Olsen is a 60 y.o. female presenting on 08/08/2020 for No chief complaint on file.   HPI     This is a telemedicine appointment due to coronavirus pandemic.  It is via telephone as pt does not have the ability for video appointments.  I connected with  Aftin Schelling on 08/08/20 by a video enabled telemedicine application and verified that I am speaking with the correct person using two identifiers.   I discussed the limitations of evaluation and management by telemedicine. The patient expressed understanding and agreed to proceed.  Pt is at home.  Provider is at office.      Pt is 73yoF with asthma and dyslipidemia.   She says she is Doing okay with things in general and her Breathing in specific.  She says she is a little bit sore from shoveling the snow off her driveway.  She has not gotten the booster yet.         Relevant past medical, surgical, family and social history reviewed and updated as indicated. Interim medical history since our last visit reviewed. Allergies and medications reviewed and updated.   Current Outpatient Medications:  .  albuterol (VENTOLIN HFA) 108 (90 Base) MCG/ACT inhaler, Inhale 2 puffs into the lungs every 6 (six) hours as needed for wheezing or shortness of breath., Disp: 3 each, Rfl: 3 .  diphenhydrAMINE (BENADRYL) 25 MG tablet, Take 25 mg by mouth every 6 (six) hours as needed for itching or allergies., Disp: , Rfl:  .  Fluticasone-Salmeterol (ADVAIR) 250-50 MCG/DOSE AEPB, Inhale 1 puff into the lungs 2 (two) times daily., Disp: 180 each, Rfl: 3 .  omeprazole (PRILOSEC) 20 MG capsule, Take 1 capsule (20 mg total) by mouth daily as needed. (Patient taking differently: Take 20 mg by mouth daily as needed (for acid reflux).), Disp: 90 capsule, Rfl: 1 .  simvastatin (ZOCOR) 20 MG tablet, Take 1 tablet (20 mg total) by  mouth at bedtime., Disp: 90 tablet, Rfl: 1   Review of Systems  Per HPI unless specifically indicated above     Objective:    LMP 07/20/2006   Wt Readings from Last 3 Encounters:  01/30/20 146 lb (66.2 kg)  03/04/19 135 lb (61.2 kg)  03/08/18 142 lb 8 oz (64.6 kg)    Physical Exam Pulmonary:     Effort: No respiratory distress.     Comments: Pt is speaking in complete sentences without dyspnea Neurological:     Mental Status: She is alert and oriented to person, place, and time.  Psychiatric:        Attention and Perception: Attention normal.        Speech: Speech normal.        Behavior: Behavior is cooperative.         Results for orders placed or performed during the hospital encounter of 08/07/20  Lipid panel  Result Value Ref Range   Cholesterol 154 0 - 200 mg/dL   Triglycerides 49 <150 mg/dL   HDL 62 >40 mg/dL   Total CHOL/HDL Ratio 2.5 RATIO   VLDL 10 0 - 40 mg/dL   LDL Cholesterol 82 0 - 99 mg/dL  Comprehensive metabolic panel  Result Value Ref Range   Sodium 138 135 - 145 mmol/L   Potassium 4.0 3.5 - 5.1 mmol/L   Chloride 105 98 -  111 mmol/L   CO2 24 22 - 32 mmol/L   Glucose, Bld 101 (H) 70 - 99 mg/dL   BUN 15 8 - 23 mg/dL   Creatinine, Ser 0.86 0.44 - 1.00 mg/dL   Calcium 8.9 8.9 - 10.3 mg/dL   Total Protein 6.5 6.5 - 8.1 g/dL   Albumin 3.8 3.5 - 5.0 g/dL   AST 33 15 - 41 U/L   ALT 37 0 - 44 U/L   Alkaline Phosphatase 73 38 - 126 U/L   Total Bilirubin 1.5 (H) 0.3 - 1.2 mg/dL   GFR, Estimated >60 >60 mL/min   Anion gap 9 5 - 15      Assessment & Plan:    Encounter Diagnoses  Name Primary?  Marland Kitchen Asthma, chronic, unspecified asthma severity, uncomplicated Yes  . Hyperlipidemia, unspecified hyperlipidemia type      -reviewed labs with pt  -discussed pneumonia vaccination and she would like to get it.  She is scheduled for a nurse visit to get immunization next week -pt to continue current medications -encouraged pt to get her covid  booster -pt to follow up 6 months for cholesterol and asthma.  She is to contact office sooner prn

## 2020-08-14 ENCOUNTER — Ambulatory Visit: Payer: Self-pay | Admitting: Physician Assistant

## 2020-08-14 VITALS — Temp 97.9°F

## 2020-08-14 DIAGNOSIS — Z23 Encounter for immunization: Secondary | ICD-10-CM

## 2020-08-14 NOTE — Patient Instructions (Signed)
Pneumococcal Polysaccharide Vaccine (PPSV23): What You Need to Know 1. Why get vaccinated? Pneumococcal polysaccharide vaccine (PPSV23) can prevent pneumococcal disease. Pneumococcal disease refers to any illness caused by pneumococcal bacteria. These bacteria can cause many types of illnesses, including pneumonia, which is an infection of the lungs. Pneumococcal bacteria are one of the most common causes of pneumonia. Besides pneumonia, pneumococcal bacteria can also cause:  Ear infections  Sinus infections  Meningitis (infection of the tissue covering the brain and spinal cord)  Bacteremia (bloodstream infection) Anyone can get pneumococcal disease, but children under 2 years of age, people with certain medical conditions, adults 65 years or older, and cigarette smokers are at the highest risk. Most pneumococcal infections are mild. However, some can result in long-term problems, such as brain damage or hearing loss. Meningitis, bacteremia, and pneumonia caused by pneumococcal disease can be fatal. 2. PPSV23 PPSV23 protects against 23 types of bacteria that cause pneumococcal disease. PPSV23 is recommended for:  All adults 65 years or older,  Anyone 2 years or older with certain medical conditions that can lead to an increased risk for pneumococcal disease. Most people need only one dose of PPSV23. A second dose of PPSV23, and another type of pneumococcal vaccine called PCV13, are recommended for certain high-risk groups. Your health care provider can give you more information. People 65 years or older should get a dose of PPSV23 even if they have already gotten one or more doses of the vaccine before they turned 65. 3. Talk with your health care provider Tell your vaccine provider if the person getting the vaccine:  Has had an allergic reaction after a previous dose of PPSV23, or has any severe, life-threatening allergies. In some cases, your health care provider may decide to postpone  PPSV23 vaccination to a future visit. People with minor illnesses, such as a cold, may be vaccinated. People who are moderately or severely ill should usually wait until they recover before getting PPSV23. Your health care provider can give you more information. 4. Risks of a vaccine reaction  Redness or pain where the shot is given, feeling tired, fever, or muscle aches can happen after PPSV23. People sometimes faint after medical procedures, including vaccination. Tell your provider if you feel dizzy or have vision changes or ringing in the ears. As with any medicine, there is a very remote chance of a vaccine causing a severe allergic reaction, other serious injury, or death. 5. What if there is a serious problem? An allergic reaction could occur after the vaccinated person leaves the clinic. If you see signs of a severe allergic reaction (hives, swelling of the face and throat, difficulty breathing, a fast heartbeat, dizziness, or weakness), call 9-1-1 and get the person to the nearest hospital. For other signs that concern you, call your health care provider. Adverse reactions should be reported to the Vaccine Adverse Event Reporting System (VAERS). Your health care provider will usually file this report, or you can do it yourself. Visit the VAERS website at www.vaers.hhs.gov or call 1-800-822-7967. VAERS is only for reporting reactions, and VAERS staff do not give medical advice. 6. How can I learn more?  Ask your health care provider.  Call your local or state health department.  Contact the Centers for Disease Control and Prevention (CDC): ? Call 1-800-232-4636 (1-800-CDC-INFO) or ? Visit CDC's website at www.cdc.gov/vaccines Vaccine Information Statement PPSV23 Vaccine (05/18/2018) This information is not intended to replace advice given to you by your health care provider. Make sure you discuss   any questions you have with your health care provider. Document Revised: 03/08/2020  Document Reviewed: 03/08/2020 Elsevier Patient Education  2021 Elsevier Inc.   

## 2020-08-14 NOTE — Progress Notes (Signed)
Pt is here to update pneumococcal vaccination.   Pt's temperature today is 97.65F. pt is feeling well today. Allergies reviewed.   Pneumovax 23 administered to pt's L deltoid on today 08-14-20 at. Reading material given to pt on AVS

## 2020-11-05 ENCOUNTER — Ambulatory Visit: Payer: Self-pay | Admitting: Physician Assistant

## 2020-11-05 ENCOUNTER — Encounter: Payer: Self-pay | Admitting: Physician Assistant

## 2020-11-05 VITALS — BP 111/66 | HR 75 | Temp 97.3°F | Wt 148.0 lb

## 2020-11-05 DIAGNOSIS — L255 Unspecified contact dermatitis due to plants, except food: Secondary | ICD-10-CM

## 2020-11-05 NOTE — Progress Notes (Signed)
   BP 111/66   Pulse 75   Temp (!) 97.3 F (36.3 C)   Wt 148 lb (67.1 kg)   LMP 07/20/2006   SpO2 97%   BMI 27.29 kg/m    Subjective:    Patient ID: Brenda Olsen, female    DOB: [birth year], 60 y.o.   MRN: [id number]  HPI: Brenda Olsen is a 60 y.o. female presenting on 11/05/2020 for No chief complaint on file.   HPI  Pt had a negative covid 19 screening questionnaire.   Pt has rash that started Friday after cutting the hedges.  She has been using benedryl cream on it.   No problems with breathing.   Relevant past medical, surgical, family and social history reviewed and updated as indicated. Interim medical history since our last visit reviewed. Allergies and medications reviewed and updated.   Current Outpatient Medications:  .  albuterol (VENTOLIN HFA) 108 (90 Base) MCG/ACT inhaler, Inhale 2 puffs into the lungs every 6 (six) hours as needed for wheezing or shortness of breath., Disp: 3 each, Rfl: 3 .  diphenhydrAMINE (BENADRYL) 25 MG tablet, Take 25 mg by mouth every 6 (six) hours as needed for itching or allergies., Disp: , Rfl:  .  Fluticasone-Salmeterol (ADVAIR) 250-50 MCG/DOSE AEPB, Inhale 1 puff into the lungs 2 (two) times daily., Disp: 180 each, Rfl: 3 .  omeprazole (PRILOSEC) 20 MG capsule, Take 1 capsule (20 mg total) by mouth daily as needed. (Patient taking differently: Take 20 mg by mouth daily as needed (for acid reflux).), Disp: 90 capsule, Rfl: 1 .  simvastatin (ZOCOR) 20 MG tablet, Take 1 tablet (20 mg total) by mouth at bedtime., Disp: 90 tablet, Rfl: 1     Review of Systems  Per HPI unless specifically indicated above     Objective:    BP 111/66   Pulse 75   Temp (!) 97.3 F (36.3 C)   Wt 148 lb (67.1 kg)   LMP 07/20/2006   SpO2 97%   BMI 27.29 kg/m   Wt Readings from Last 3 Encounters:  11/05/20 148 lb (67.1 kg)  01/30/20 146 lb (66.2 kg)  03/04/19 135 lb (61.2 kg)    Physical Exam Constitutional:      General: She is not in acute  distress.    Appearance: She is not ill-appearing.  HENT:     Head: Normocephalic and atraumatic.  Pulmonary:     Effort: Pulmonary effort is normal. No respiratory distress.  Skin:    Comments: Multiple Linear eruptions BUE and on face/left temple or raised vesicles on red skin.    Neurological:     Mental Status: She is alert and oriented to person, place, and time.  Psychiatric:        Attention and Perception: Attention normal.        Mood and Affect: Mood normal.        Speech: Speech normal.        Behavior: Behavior normal. Behavior is cooperative.              Assessment & Plan:    Encounter Diagnosis  Name Primary?  . Rhus dermatitis Yes     Pt was given sample prednisone 10mg  6day pack to use as directed.  She can take otc benedryl PO prn

## 2020-12-19 ENCOUNTER — Other Ambulatory Visit: Payer: Self-pay | Admitting: Physician Assistant

## 2020-12-30 ENCOUNTER — Other Ambulatory Visit: Payer: Self-pay | Admitting: Physician Assistant

## 2020-12-30 MED ORDER — ADVAIR HFA 230-21 MCG/ACT IN AERO: 2.0000 | INHALATION_SPRAY | Freq: Two times a day (BID) | RESPIRATORY_TRACT | 3 refills | Status: DC

## 2021-01-22 ENCOUNTER — Other Ambulatory Visit: Payer: Self-pay | Admitting: Physician Assistant

## 2021-01-22 DIAGNOSIS — E785 Hyperlipidemia, unspecified: Secondary | ICD-10-CM

## 2021-01-31 ENCOUNTER — Other Ambulatory Visit: Payer: Self-pay

## 2021-01-31 ENCOUNTER — Other Ambulatory Visit (HOSPITAL_COMMUNITY)
Admission: RE | Admit: 2021-01-31 | Discharge: 2021-01-31 | Disposition: A | Discharge status: Home / Self Care | Payer: Self-pay | Source: Ambulatory Visit | Attending: Physician Assistant | Admitting: Physician Assistant

## 2021-01-31 DIAGNOSIS — E785 Hyperlipidemia, unspecified: Secondary | ICD-10-CM | POA: Insufficient documentation

## 2021-01-31 LAB — HEPATIC FUNCTION PANEL
ALT: 37 U/L (ref 0–44)
AST: 29 U/L (ref 15–41)
Albumin: 3.7 g/dL (ref 3.5–5.0)
Alkaline Phosphatase: 76 U/L (ref 38–126)
Bilirubin, Direct: 0.2 mg/dL (ref 0.0–0.2)
Indirect Bilirubin: 1.4 mg/dL — ABNORMAL HIGH (ref 0.3–0.9)
Total Bilirubin: 1.6 mg/dL — ABNORMAL HIGH (ref 0.3–1.2)
Total Protein: 6.6 g/dL (ref 6.5–8.1)

## 2021-01-31 LAB — LIPID PANEL
Cholesterol: 163 mg/dL (ref 0–200)
HDL: 63 mg/dL (ref >40)
LDL Cholesterol: 89 mg/dL (ref 0–99)
Total CHOL/HDL Ratio: 2.6 RATIO
Triglycerides: 57 mg/dL (ref <150)
VLDL: 11 mg/dL (ref 0–40)

## 2021-02-03 ENCOUNTER — Encounter: Payer: Self-pay | Admitting: Physician Assistant

## 2021-02-03 ENCOUNTER — Ambulatory Visit: Payer: Self-pay | Admitting: Physician Assistant

## 2021-02-03 DIAGNOSIS — E785 Hyperlipidemia, unspecified: Secondary | ICD-10-CM

## 2021-02-03 DIAGNOSIS — J45909 Unspecified asthma, uncomplicated: Secondary | ICD-10-CM

## 2021-02-03 DIAGNOSIS — Z1239 Encounter for other screening for malignant neoplasm of breast: Secondary | ICD-10-CM

## 2021-02-03 NOTE — Progress Notes (Signed)
LMP 07/20/2006    Subjective:    Patient ID: Brenda Olsen, female    DOB: 09/27/1960, 60 y.o.   MRN: [id number]  HPI: Brenda Olsen is a 61 y.o. female presenting on 02/03/2021 for No chief complaint on file.   HPI   This is a telemedicine appointment due to coronavirus pandemic.  It is via telephone as pt was unable to get connected through [employer] (her phone is from a magic jack).  I connected with  Brenda Olsen on 02/03/21 by a video enabled telemedicine application and verified that I am speaking with the correct person using two identifiers.   I discussed the limitations of evaluation and management by telemedicine. The patient expressed understanding and agreed to proceed.  Pt is at home.  Provider is at office.    Pt is 8yoF with routine appointment to follow up asthma and dyslipidemia.  She says she is doing fine but has some problems with her breathing when she goes out into the humid July heat.  She does not have any problems with her breathing in the a/c.     Relevant past medical, surgical, family and social history reviewed and updated as indicated. Interim medical history since our last visit reviewed. Allergies and medications reviewed and updated.   Current Outpatient Medications:    albuterol (VENTOLIN HFA) 108 (90 Base) MCG/ACT inhaler, Inhale 2 puffs into the lungs every 6 (six) hours as needed for wheezing or shortness of breath., Disp: 3 each, Rfl: 3   fluticasone-salmeterol (ADVAIR HFA) 230-21 MCG/ACT inhaler, Inhale 2 puffs into the lungs 2 (two) times daily., Disp: 3 each, Rfl: 3   Fluticasone-Salmeterol (ADVAIR) 250-50 MCG/DOSE AEPB, Inhale 1 puff into the lungs 2 (two) times daily., Disp: 180 each, Rfl: 3   simvastatin (ZOCOR) 20 MG tablet, Take 1 tablet (20 mg total) by mouth at bedtime., Disp: 90 tablet, Rfl: 1   diphenhydrAMINE (BENADRYL) 25 MG tablet, Take 25 mg by mouth every 6 (six) hours as needed for itching or allergies., Disp: , Rfl:     omeprazole (PRILOSEC) 20 MG capsule, Take 1 capsule (20 mg total) by mouth daily as needed. (Patient taking differently: Take 20 mg by mouth daily as needed (for acid reflux).), Disp: 90 capsule, Rfl: 1    Review of Systems  Per HPI unless specifically indicated above     Objective:    LMP 07/20/2006   Wt Readings from Last 3 Encounters:  11/05/20 148 lb (67.1 kg)  01/30/20 146 lb (66.2 kg)  03/04/19 135 lb (61.2 kg)    Physical Exam Pulmonary:     Effort: No respiratory distress.     Comments: Pt is talking in complete sentences without dyspnea Neurological:     Mental Status: She is alert and oriented to person, place, and time.  Psychiatric:        Attention and Perception: Attention normal.        Speech: Speech normal.        Behavior: Behavior is cooperative.    Results for orders placed or performed during the hospital encounter of 01/31/21  Lipid panel  Result Value Ref Range   Cholesterol 163 0 - 200 mg/dL   Triglycerides 57 <150 mg/dL   HDL 63 >40 mg/dL   Total CHOL/HDL Ratio 2.6 RATIO   VLDL 11 0 - 40 mg/dL   LDL Cholesterol 89 0 - 99 mg/dL  Hepatic function panel  Result Value Ref Range   Total Protein 6.6 6.5 -  8.1 g/dL   Albumin 3.7 3.5 - 5.0 g/dL   AST 29 15 - 41 U/L   ALT 37 0 - 44 U/L   Alkaline Phosphatase 76 38 - 126 U/L   Total Bilirubin 1.6 (H) 0.3 - 1.2 mg/dL   Bilirubin, Direct 0.2 0.0 - 0.2 mg/dL   Indirect Bilirubin 1.4 (H) 0.3 - 0.9 mg/dL      Assessment & Plan:    Encounter Diagnoses  Name Primary?   Hyperlipidemia, unspecified hyperlipidemia type Yes   Asthma, chronic, unspecified asthma severity, uncomplicated    Encounter for screening for malignant neoplasm of breast, unspecified screening modality       -reviewed labs with pt -pt to continue current medications -will refer for screening mammogram -pt to follow up in 6 months.  She is to contact office sooner prn

## 2021-02-05 ENCOUNTER — Other Ambulatory Visit: Payer: Self-pay | Admitting: Physician Assistant

## 2021-02-05 DIAGNOSIS — Z1239 Encounter for other screening for malignant neoplasm of breast: Secondary | ICD-10-CM

## 2021-02-06 ENCOUNTER — Encounter: Payer: Self-pay | Admitting: *Deleted

## 2021-02-11 ENCOUNTER — Encounter: Payer: Self-pay | Admitting: Physician Assistant

## 2021-02-11 ENCOUNTER — Ambulatory Visit: Payer: Self-pay | Admitting: Physician Assistant

## 2021-02-11 VITALS — BP 106/61 | HR 76 | Temp 97.1°F | Wt 148.0 lb

## 2021-02-11 DIAGNOSIS — L259 Unspecified contact dermatitis, unspecified cause: Secondary | ICD-10-CM

## 2021-02-11 MED ORDER — PREDNISONE 10 MG (21) PO TBPK: ORAL_TABLET | ORAL | 0 refills | Status: DC

## 2021-02-11 NOTE — Progress Notes (Signed)
BP 106/61   Pulse 76   Temp (!) 97.1 F (36.2 C)   Wt 148 lb (67.1 kg)   LMP 07/20/2006   SpO2 95%   BMI 27.29 kg/m    Subjective:    Patient ID: Brenda Olsen, female    DOB: Judit 13, 1962, 60 y.o.   MRN: [id number]  HPI: Brenda Olsen is a 60 y.o. female presenting on 02/11/2021 for No chief complaint on file.   HPI  Pt had a negative covid 19 screening questionnaire.   Pt is in today for an issue with her right eyelid.  She says it started day before yesterday.  She says it itches a lot.  She does not wear contact lenses or glasses except for readers.  She has not had any changes with her eyes or vision.    Pt also reports having red irritated area on lower lip and itchy spots of both arms.  She has been using some OTC cortisone cream on the arm spots.      Relevant past medical, surgical, family and social history reviewed and updated as indicated. Interim medical history since our last visit reviewed. Allergies and medications reviewed and updated.  Review of Systems  Per HPI unless specifically indicated above     Objective:    BP 106/61   Pulse 76   Temp (!) 97.1 F (36.2 C)   Wt 148 lb (67.1 kg)   LMP 07/20/2006   SpO2 95%   BMI 27.29 kg/m   Wt Readings from Last 3 Encounters:  02/11/21 148 lb (67.1 kg)  11/05/20 148 lb (67.1 kg)  01/30/20 146 lb (66.2 kg)    Physical Exam Constitutional:      General: She is not in acute distress.    Appearance: She is not ill-appearing.  HENT:     Head: Normocephalic and atraumatic.     Comments: Skin inferior to lower lip is irritated, deeper pink than surrounding skin, and dry irritated.  No discrete lesions.  Irritated area is almost the width of the lips.  Eyes:     Extraocular Movements: Extraocular movements intact.     Conjunctiva/sclera: Conjunctivae normal.     Comments: Left eyelid unremarkable Right eyelid everted with no FB or nodule.  The right eyelid appears to have a linear rash- a thin raised line  down near to the lash line.  The rash line is very mildly pinker than the rest of the eyelid.  There is no abscess or erythema.   Pulmonary:     Effort: Pulmonary effort is normal.  Skin:    Comments: Spots scattered on BUE- six seen when stopped counting- the areas are reddish/pink irritated areas with slightly raised central area.  No secondary infection seen  Neurological:     Mental Status: She is alert and oriented to person, place, and time.  Psychiatric:        Behavior: Behavior normal.          Assessment & Plan:    Encounter Diagnosis  Name Primary?   Contact dermatitis, unspecified contact dermatitis type, unspecified trigger Yes     -discussed options of topical versus oral for treatment of itchy spots.  Due to difficulty getting all of the places on her arms and also that eyelids have thin skin, pt prefers oral.  She was given sample prednisone '10mg'$  pack.  She is recommended to try cool compress to eyelid to reduce itching (as opposed to warm compress which may increase itching) -  pt to contact office if symptoms worsen or persist

## 2021-02-28 ENCOUNTER — Ambulatory Visit (HOSPITAL_COMMUNITY)
Admission: RE | Admit: 2021-02-28 | Discharge: 2021-02-28 | Disposition: A | Discharge status: Home / Self Care | Payer: Self-pay | Source: Ambulatory Visit | Attending: Physician Assistant | Admitting: Physician Assistant

## 2021-02-28 ENCOUNTER — Other Ambulatory Visit: Payer: Self-pay

## 2021-02-28 DIAGNOSIS — Z1239 Encounter for other screening for malignant neoplasm of breast: Secondary | ICD-10-CM | POA: Insufficient documentation

## 2021-03-17 ENCOUNTER — Other Ambulatory Visit: Payer: Self-pay | Admitting: Physician Assistant

## 2021-03-17 MED ORDER — SIMVASTATIN 20 MG PO TABS: 20.0000 mg | ORAL_TABLET | Freq: Every day | ORAL | 11 refills | Status: DC

## 2021-06-16 ENCOUNTER — Other Ambulatory Visit: Payer: Self-pay | Admitting: Physician Assistant

## 2021-07-28 ENCOUNTER — Other Ambulatory Visit: Payer: Self-pay | Admitting: Physician Assistant

## 2021-07-28 DIAGNOSIS — E785 Hyperlipidemia, unspecified: Secondary | ICD-10-CM

## 2021-08-11 ENCOUNTER — Ambulatory Visit: Payer: Self-pay | Admitting: Physician Assistant

## 2021-08-15 ENCOUNTER — Other Ambulatory Visit (HOSPITAL_COMMUNITY)
Admission: RE | Admit: 2021-08-15 | Discharge: 2021-08-15 | Disposition: A | Discharge status: Home / Self Care | Payer: Self-pay | Source: Ambulatory Visit | Attending: Physician Assistant | Admitting: Physician Assistant

## 2021-08-15 DIAGNOSIS — E785 Hyperlipidemia, unspecified: Secondary | ICD-10-CM | POA: Insufficient documentation

## 2021-08-15 LAB — HEPATIC FUNCTION PANEL
ALT: 35 U/L (ref 0–44)
AST: 28 U/L (ref 15–41)
Albumin: 4 g/dL (ref 3.5–5.0)
Alkaline Phosphatase: 86 U/L (ref 38–126)
Bilirubin, Direct: 0.2 mg/dL (ref 0.0–0.2)
Indirect Bilirubin: 1.3 mg/dL — ABNORMAL HIGH (ref 0.3–0.9)
Total Bilirubin: 1.5 mg/dL — ABNORMAL HIGH (ref 0.3–1.2)
Total Protein: 7.1 g/dL (ref 6.5–8.1)

## 2021-08-15 LAB — LIPID PANEL
Cholesterol: 163 mg/dL (ref 0–200)
HDL: 58 mg/dL (ref >40)
LDL Cholesterol: 91 mg/dL (ref 0–99)
Total CHOL/HDL Ratio: 2.8 RATIO
Triglycerides: 69 mg/dL (ref <150)
VLDL: 14 mg/dL (ref 0–40)

## 2021-08-18 ENCOUNTER — Encounter: Payer: Self-pay | Admitting: Physician Assistant

## 2021-08-18 ENCOUNTER — Ambulatory Visit: Payer: Self-pay | Admitting: Physician Assistant

## 2021-08-18 DIAGNOSIS — J45909 Unspecified asthma, uncomplicated: Secondary | ICD-10-CM

## 2021-08-18 DIAGNOSIS — E785 Hyperlipidemia, unspecified: Secondary | ICD-10-CM

## 2021-08-18 MED ORDER — SIMVASTATIN 20 MG PO TABS: 20.0000 mg | ORAL_TABLET | Freq: Every day | ORAL | 11 refills | Status: DC

## 2021-08-18 MED ORDER — ALBUTEROL SULFATE HFA 108 (90 BASE) MCG/ACT IN AERS: 2.0000 | INHALATION_SPRAY | Freq: Four times a day (QID) | RESPIRATORY_TRACT | 3 refills | Status: DC | PRN

## 2021-08-18 MED ORDER — OMEPRAZOLE 20 MG PO CPDR: 20.0000 mg | DELAYED_RELEASE_CAPSULE | Freq: Every day | ORAL | 1 refills | Status: DC | PRN

## 2021-08-18 NOTE — Progress Notes (Signed)
LMP 07/20/2006    Subjective:    Patient ID: Brenda Olsen, female    DOB: [birth year], 61 y.o.   MRN: [id number]  HPI: Brenda Olsen is a 61 y.o. female presenting on 08/18/2021 for No chief complaint on file.   HPI  This is a telemediine appointment.   It is via telephone as pt has poor signal at her home is not good enough to connect through [employer].  I connected with  Brenda Olsen on 08/18/21 by a video enabled telemedicine application and verified that I am speaking with the correct person using two identifiers.   I discussed the limitations of evaluation and management by telemedicine. The patient expressed understanding and agreed to proceed.  Pt is at home.  Provider is at office.   Pt is 40yoF with routine follow up for Cholesterol and asthma.  She says she is doing well and she has no complaints.      Relevant past medical, surgical, family and social history reviewed and updated as indicated. Interim medical history since our last visit reviewed. Allergies and medications reviewed and updated.    Current Outpatient Medications:    albuterol (VENTOLIN HFA) 108 (90 Base) MCG/ACT inhaler, Inhale 2 puffs into the lungs every 6 (six) hours as needed for wheezing or shortness of breath., Disp: 3 each, Rfl: 3   diphenhydrAMINE (BENADRYL) 25 MG tablet, Take 25 mg by mouth every 6 (six) hours as needed for itching or allergies., Disp: , Rfl:    fluticasone-salmeterol (ADVAIR HFA) 230-21 MCG/ACT inhaler, Inhale 2 puffs into the lungs 2 (two) times daily., Disp: 3 each, Rfl: 3   Fluticasone-Salmeterol (ADVAIR) 250-50 MCG/DOSE AEPB, Inhale 1 puff into the lungs 2 (two) times daily., Disp: 180 each, Rfl: 3   omeprazole (PRILOSEC) 20 MG capsule, Take 1 capsule (20 mg total) by mouth daily as needed., Disp: 90 capsule, Rfl: 1   simvastatin (ZOCOR) 20 MG tablet, Take 1 tablet (20 mg total) by mouth at bedtime., Disp: 30 tablet, Rfl: 11  \ Review of Systems  Per HPI unless specifically  indicated above     Objective:    LMP 07/20/2006   Wt Readings from Last 3 Encounters:  02/11/21 148 lb (67.1 kg)  11/05/20 148 lb (67.1 kg)  01/30/20 146 lb (66.2 kg)    Physical Exam Constitutional:      General: She is not in acute distress. Pulmonary:     Effort: Pulmonary effort is normal. No respiratory distress.     Comments: Pt is talking in complete sentences without dyspnea Neurological:     Mental Status: She is alert and oriented to person, place, and time.  Psychiatric:        Attention and Perception: Attention normal.        Speech: Speech normal.        Behavior: Behavior is cooperative.    Results for orders placed or performed during the hospital encounter of 08/15/21  Hepatic function panel  Result Value Ref Range   Total Protein 7.1 6.5 - 8.1 g/dL   Albumin 4.0 3.5 - 5.0 g/dL   AST 28 15 - 41 U/L   ALT 35 0 - 44 U/L   Alkaline Phosphatase 86 38 - 126 U/L   Total Bilirubin 1.5 (H) 0.3 - 1.2 mg/dL   Bilirubin, Direct 0.2 0.0 - 0.2 mg/dL   Indirect Bilirubin 1.3 (H) 0.3 - 0.9 mg/dL  Lipid panel  Result Value Ref Range   Cholesterol 163 0 -  200 mg/dL   Triglycerides 69 <150 mg/dL   HDL 58 >40 mg/dL   Total CHOL/HDL Ratio 2.8 RATIO   VLDL 14 0 - 40 mg/dL   LDL Cholesterol 91 0 - 99 mg/dL      Assessment & Plan:   Encounter Diagnoses  Name Primary?   Hyperlipidemia, unspecified hyperlipidemia type Yes   Asthma, chronic, unspecified asthma severity, uncomplicated      -Reviewed labs with pt -pt encouraged to complete Re-enrolment with [facility] and update her  [facility] -she is to continue current medications -she will follow up in 6 months.  She is to contact office sooner prn

## 2021-10-30 ENCOUNTER — Telehealth: Payer: Self-pay | Admitting: Physician Assistant

## 2021-10-30 NOTE — Telephone Encounter (Signed)
Pt was called and notified that she needs to update her enrollment.  She was given contact information for [facility]. ?

## 2021-11-24 NOTE — Congregational Nurse Program (Signed)
Reviewed pt health questionnaires (mental health and social determinants)  ?No current needs at this time to be addressed with clinical staff outside of [facility] enrollment  ? ?

## 2022-01-01 ENCOUNTER — Other Ambulatory Visit: Payer: Self-pay | Admitting: Physician Assistant

## 2022-01-01 DIAGNOSIS — E785 Hyperlipidemia, unspecified: Secondary | ICD-10-CM

## 2022-01-06 ENCOUNTER — Other Ambulatory Visit: Payer: Self-pay | Admitting: Physician Assistant

## 2022-01-06 ENCOUNTER — Telehealth: Payer: Self-pay

## 2022-01-06 DIAGNOSIS — E785 Hyperlipidemia, unspecified: Secondary | ICD-10-CM

## 2022-01-06 NOTE — Telephone Encounter (Signed)
Called client regarding confusion regarding her inhalers Albuterol and Advair. She states she received an application unsolicited from [place] applied then received a letter stating [facility] does not have her medications and was denied.  Discussed with client that Advair now has a generic called Wexela that [facility] has been prescribing and there is an application process. I will contact [facility] office to determine what she should do for assistance.  It appears her Albuterol has been sent to [facility] in the past also. She may need to request refills from [facility] and if a refill request is needed they will send the provider a request. I will also check on this for her to be sure and will plan to contact client with needed next steps. Will confirm enrollment also with [facility] as client renewed [facility] with Marnee Spring LPN on 08/28/54    Debria Garret RN Clara Gunn/[facility]

## 2022-01-07 ENCOUNTER — Telehealth: Payer: Self-pay

## 2022-01-07 NOTE — Telephone Encounter (Signed)
Called client to update her on her inhalers.  I contacted [facility] and she is active until 03/20/22  Confirmed she has refills on Albuterol inhaler and they confirmed they do have Advair and that they could fill it, she does need refills.  Recommended she call [facility] to request refills of her medications and if she needs refills from the provider, they normally contact provider with those requests. We are unsure, why she received applications for [facility] and was denied, client prefers to use [facility] if that is available to lessen the confusion.  Asked client to contact me if she has any difficulties. She states she will do so.  [employer] [employer]

## 2022-01-19 ENCOUNTER — Ambulatory Visit: Payer: Self-pay | Admitting: Physician Assistant

## 2022-01-28 ENCOUNTER — Ambulatory Visit: Payer: Self-pay | Admitting: Physician Assistant

## 2022-02-02 ENCOUNTER — Other Ambulatory Visit (HOSPITAL_COMMUNITY)
Admission: RE | Admit: 2022-02-02 | Discharge: 2022-02-02 | Disposition: A | Discharge status: Home / Self Care | Payer: Self-pay | Source: Ambulatory Visit | Attending: Physician Assistant | Admitting: Physician Assistant

## 2022-02-02 DIAGNOSIS — E785 Hyperlipidemia, unspecified: Secondary | ICD-10-CM | POA: Insufficient documentation

## 2022-02-02 LAB — COMPREHENSIVE METABOLIC PANEL
ALT: 36 U/L (ref 0–44)
AST: 29 U/L (ref 15–41)
Albumin: 4 g/dL (ref 3.5–5.0)
Alkaline Phosphatase: 94 U/L (ref 38–126)
Anion gap: 5 (ref 5–15)
BUN: 15 mg/dL (ref 8–23)
CO2: 27 mmol/L (ref 22–32)
Calcium: 8.9 mg/dL (ref 8.9–10.3)
Chloride: 108 mmol/L (ref 98–111)
Creatinine, Ser: 0.87 mg/dL (ref 0.44–1.00)
GFR, Estimated: >60 mL/min (ref >60)
Glucose, Bld: 95 mg/dL (ref 70–99)
Potassium: 3.9 mmol/L (ref 3.5–5.1)
Sodium: 140 mmol/L (ref 135–145)
Total Bilirubin: 1.8 mg/dL — ABNORMAL HIGH (ref 0.3–1.2)
Total Protein: 7.1 g/dL (ref 6.5–8.1)

## 2022-02-02 LAB — LIPID PANEL
Cholesterol: 168 mg/dL (ref 0–200)
HDL: 69 mg/dL (ref >40)
LDL Cholesterol: 85 mg/dL (ref 0–99)
Total CHOL/HDL Ratio: 2.4 RATIO
Triglycerides: 72 mg/dL (ref <150)
VLDL: 14 mg/dL (ref 0–40)

## 2022-02-04 ENCOUNTER — Encounter: Payer: Self-pay | Admitting: Physician Assistant

## 2022-02-04 ENCOUNTER — Ambulatory Visit: Payer: Self-pay | Admitting: Physician Assistant

## 2022-02-04 VITALS — BP 112/66 | HR 86 | Temp 98.3°F | Wt 147.9 lb

## 2022-02-04 DIAGNOSIS — Z1239 Encounter for other screening for malignant neoplasm of breast: Secondary | ICD-10-CM

## 2022-02-04 DIAGNOSIS — Z5320 Procedure and treatment not carried out because of patient's decision for unspecified reasons: Secondary | ICD-10-CM

## 2022-02-04 DIAGNOSIS — E785 Hyperlipidemia, unspecified: Secondary | ICD-10-CM

## 2022-02-04 DIAGNOSIS — J45909 Unspecified asthma, uncomplicated: Secondary | ICD-10-CM

## 2022-02-04 MED ORDER — OMEPRAZOLE 20 MG PO CPDR: 20.0000 mg | DELAYED_RELEASE_CAPSULE | Freq: Every day | ORAL | 1 refills | Status: DC | PRN

## 2022-02-04 MED ORDER — SIMVASTATIN 20 MG PO TABS: 20.0000 mg | ORAL_TABLET | Freq: Every day | ORAL | 11 refills | Status: DC

## 2022-02-04 MED ORDER — FLUTICASONE-SALMETEROL 230-21 MCG/ACT IN AERO: 2.0000 | INHALATION_SPRAY | Freq: Two times a day (BID) | RESPIRATORY_TRACT | 3 refills | Status: DC

## 2022-02-04 MED ORDER — ALBUTEROL SULFATE HFA 108 (90 BASE) MCG/ACT IN AERS: 2.0000 | INHALATION_SPRAY | Freq: Four times a day (QID) | RESPIRATORY_TRACT | 3 refills | Status: DC | PRN

## 2022-02-04 NOTE — Progress Notes (Signed)
BP 112/66   Pulse 86   Temp 98.3 F (36.8 C)   Wt 147 lb 14.4 oz (67.1 kg)   LMP 07/20/2006   SpO2 96%   BMI 27.27 kg/m    Subjective:    Patient ID: Brenda Olsen, female    DOB: 19-Oct-1960, 61 y.o.   MRN: [id number]  HPI: Brenda Olsen is a 61 y.o. female presenting on 02/04/2022 for Hyperlipidemia and Asthma   HPI  Chief Complaint  Patient presents with   Hyperlipidemia   Asthma    Pt is in for routine appointment.  She says she is doing well and she has no complaints.  Her breathing has been good.  She does have some difficulties if she does too much work outside in the heat and humidity.    Relevant past medical, surgical, family and social history reviewed and updated as indicated. Interim medical history since our last visit reviewed. Allergies and medications reviewed and updated.   Current Outpatient Medications:    albuterol (VENTOLIN HFA) 108 (90 Base) MCG/ACT inhaler, Inhale 2 puffs into the lungs every 6 (six) hours as needed for wheezing or shortness of breath., Disp: 3 each, Rfl: 3   diphenhydrAMINE (BENADRYL) 25 MG tablet, Take 25 mg by mouth every 6 (six) hours as needed for itching or allergies., Disp: , Rfl:    fluticasone-salmeterol (ADVAIR HFA) 230-21 MCG/ACT inhaler, Inhale 2 puffs into the lungs 2 (two) times daily., Disp: 3 each, Rfl: 3   omeprazole (PRILOSEC) 20 MG capsule, Take 1 capsule (20 mg total) by mouth daily as needed., Disp: 90 capsule, Rfl: 1   simvastatin (ZOCOR) 20 MG tablet, Take 1 tablet (20 mg total) by mouth at bedtime., Disp: 30 tablet, Rfl: 11    Review of Systems  Per HPI unless specifically indicated above     Objective:    BP 112/66   Pulse 86   Temp 98.3 F (36.8 C)   Wt 147 lb 14.4 oz (67.1 kg)   LMP 07/20/2006   SpO2 96%   BMI 27.27 kg/m   Wt Readings from Last 3 Encounters:  02/04/22 147 lb 14.4 oz (67.1 kg)  02/11/21 148 lb (67.1 kg)  11/05/20 148 lb (67.1 kg)    Physical Exam Vitals reviewed.   Constitutional:      General: She is not in acute distress.    Appearance: She is well-developed. She is not ill-appearing.  HENT:     Head: Normocephalic and atraumatic.     Right Ear: Tympanic membrane, ear canal and external ear normal.     Left Ear: Tympanic membrane, ear canal and external ear normal.  Eyes:     Extraocular Movements: Extraocular movements intact.     Conjunctiva/sclera: Conjunctivae normal.     Pupils: Pupils are equal, round, and reactive to light.  Neck:     Thyroid: No thyromegaly.  Cardiovascular:     Rate and Rhythm: Normal rate and regular rhythm.  Pulmonary:     Effort: Pulmonary effort is normal.     Breath sounds: Normal breath sounds.  Abdominal:     General: Bowel sounds are normal.     Palpations: Abdomen is soft. There is no mass.     Tenderness: There is no abdominal tenderness.  Musculoskeletal:     Cervical back: Neck supple.     Right lower leg: No edema.     Left lower leg: No edema.  Lymphadenopathy:     Cervical: No cervical adenopathy.  Skin:    General: Skin is warm and dry.  Neurological:     Mental Status: She is alert and oriented to person, place, and time.     Motor: Motor function is intact. No weakness or tremor.     Gait: Gait is intact. Gait normal.     Deep Tendon Reflexes:     Reflex Scores:      Patellar reflexes are 2+ on the right side and 2+ on the left side. Psychiatric:        Attention and Perception: Attention normal.        Mood and Affect: Mood normal.        Speech: Speech normal.        Behavior: Behavior normal. Behavior is cooperative.     Results for orders placed or performed during the hospital encounter of 02/02/22  Lipid panel  Result Value Ref Range   Cholesterol 168 0 - 200 mg/dL   Triglycerides 72 <150 mg/dL   HDL 69 >40 mg/dL   Total CHOL/HDL Ratio 2.4 RATIO   VLDL 14 0 - 40 mg/dL   LDL Cholesterol 85 0 - 99 mg/dL  Comprehensive metabolic panel  Result Value Ref Range   Sodium 140  135 - 145 mmol/L   Potassium 3.9 3.5 - 5.1 mmol/L   Chloride 108 98 - 111 mmol/L   CO2 27 22 - 32 mmol/L   Glucose, Bld 95 70 - 99 mg/dL   BUN 15 8 - 23 mg/dL   Creatinine, Ser 0.87 0.44 - 1.00 mg/dL   Calcium 8.9 8.9 - 10.3 mg/dL   Total Protein 7.1 6.5 - 8.1 g/dL   Albumin 4.0 3.5 - 5.0 g/dL   AST 29 15 - 41 U/L   ALT 36 0 - 44 U/L   Alkaline Phosphatase 94 38 - 126 U/L   Total Bilirubin 1.8 (H) 0.3 - 1.2 mg/dL   GFR, Estimated >60 >60 mL/min   Anion gap 5 5 - 15      Assessment & Plan:   Encounter Diagnoses  Name Primary?   Asthma, chronic, unspecified asthma severity, uncomplicated Yes   Hyperlipidemia, unspecified hyperlipidemia type    Encounter for screening for malignant neoplasm of breast, unspecified screening modality    Pap smear of cervix declined     -reviewed labs with pt -pt to continue current medications -referred for screening Mammogram -Declined PAP -pt colon cancer screening/colonoscopy is UTD -pt to follow up 6 months.  She is to contact office sooner prn

## 2022-03-24 ENCOUNTER — Telehealth: Payer: Self-pay

## 2022-03-24 NOTE — Telephone Encounter (Signed)
Telephoned patient at mobile number. Left a voice message with [facility] (scholarship) contact information. 

## 2022-03-25 ENCOUNTER — Other Ambulatory Visit: Payer: Self-pay | Admitting: Physician Assistant

## 2022-03-25 DIAGNOSIS — Z1231 Encounter for screening mammogram for malignant neoplasm of breast: Secondary | ICD-10-CM

## 2022-04-16 ENCOUNTER — Ambulatory Visit (HOSPITAL_COMMUNITY)
Admission: RE | Admit: 2022-04-16 | Discharge: 2022-04-16 | Disposition: A | Discharge status: Home / Self Care | Payer: Self-pay | Source: Ambulatory Visit | Attending: Physician Assistant | Admitting: Physician Assistant

## 2022-04-16 DIAGNOSIS — Z1231 Encounter for screening mammogram for malignant neoplasm of breast: Secondary | ICD-10-CM | POA: Insufficient documentation

## 2022-04-22 ENCOUNTER — Telehealth: Payer: Self-pay

## 2022-04-22 NOTE — Telephone Encounter (Signed)
Telephoned patient at home number. Left a voice message with [facility] contact information. 

## 2022-04-24 ENCOUNTER — Other Ambulatory Visit (HOSPITAL_COMMUNITY): Payer: Self-pay | Admitting: Obstetrics and Gynecology

## 2022-04-24 DIAGNOSIS — R928 Other abnormal and inconclusive findings on diagnostic imaging of breast: Secondary | ICD-10-CM

## 2022-05-08 ENCOUNTER — Inpatient Hospital Stay: Payer: Self-pay | Attending: Obstetrics and Gynecology | Admitting: Hematology and Oncology

## 2022-05-08 VITALS — BP 123/82 | Wt 151.8 lb

## 2022-05-08 DIAGNOSIS — N6310 Unspecified lump in the right breast, unspecified quadrant: Secondary | ICD-10-CM

## 2022-05-08 NOTE — Progress Notes (Signed)
[name] is a 61 y.o. female who presents to [facility] clinic today with no complaints. Recent mammogram revealed possible distortion or mass in the right side.    Pap Smear: Pap not smear completed today. Patient does not remember when her last Pap smear was and declines to have them. She has no concerns today.   Physical exam: Breasts Breasts symmetrical. No skin abnormalities bilateral breasts. No nipple retraction bilateral breasts. No nipple discharge bilateral breasts. No lymphadenopathy. No lumps palpated bilateral breasts.     MS DIGITAL SCREENING TOMO BILATERAL  Result Date: 04/17/2022 CLINICAL DATA:  Screening. EXAM: DIGITAL SCREENING BILATERAL MAMMOGRAM WITH TOMOSYNTHESIS AND CAD TECHNIQUE: Bilateral screening digital craniocaudal and mediolateral oblique mammograms were obtained. Bilateral screening digital breast tomosynthesis was performed. The images were evaluated with computer-aided detection. COMPARISON:  Previous exam(s). ACR Breast Density Category c: The breast tissue is heterogeneously dense, which may obscure small masses. FINDINGS: In the right breast, a possible mass and a possible distortion warrants further evaluation. The possible mass is seen within the upper-outer quadrant of the RIGHT breast, cc slice 34, MLO 1/2 slice 40 and MLO 2/2 slice 37. The possible distortion is seen within the upper RIGHT breast on MLO 1/2 slice 40 and MLO 2/2 slice 37. In the left breast, no findings suspicious for malignancy. IMPRESSION: Further evaluation is suggested for a possible mass and a possible distortion in the right breast. RECOMMENDATION: Diagnostic mammogram and possibly ultrasound of the right breast. (Code:FI-R-23M) The patient will be contacted regarding the findings, and additional imaging will be scheduled. BI-RADS CATEGORY  0: Incomplete. Need additional imaging evaluation and/or prior mammograms for comparison. Electronically Signed   By: Franki Cabot M.D.   On: 04/17/2022  12:02   MS DIGITAL SCREENING TOMO BILATERAL  Result Date: 03/03/2021 CLINICAL DATA:  Screening. EXAM: DIGITAL SCREENING BILATERAL MAMMOGRAM WITH TOMOSYNTHESIS AND CAD TECHNIQUE: Bilateral screening digital craniocaudal and mediolateral oblique mammograms were obtained. Bilateral screening digital breast tomosynthesis was performed. The images were evaluated with computer-aided detection. COMPARISON:  Previous exam(s). ACR Breast Density Category c: The breast tissue is heterogeneously dense, which may obscure small masses. FINDINGS: There are no findings suspicious for malignancy. IMPRESSION: No mammographic evidence of malignancy. A result letter of this screening mammogram will be mailed directly to the patient. RECOMMENDATION: Screening mammogram in one year. (Code:SM-B-01Y) BI-RADS CATEGORY  1: Negative. Electronically Signed   By: Claudie Revering M.D.   On: 03/03/2021 14:52   MS DIGITAL SCREENING TOMO BILATERAL  Result Date: 02/26/2020 CLINICAL DATA:  Screening. EXAM: DIGITAL SCREENING BILATERAL MAMMOGRAM WITH TOMO AND CAD COMPARISON:  Previous exam(s). ACR Breast Density Category c: The breast tissue is heterogeneously dense, which may obscure small masses. FINDINGS: There are no findings suspicious for malignancy. Images were processed with CAD. IMPRESSION: No mammographic evidence of malignancy. A result letter of this screening mammogram will be mailed directly to the patient. RECOMMENDATION: Screening mammogram in one year. (Code:SM-B-01Y) BI-RADS CATEGORY  1: Negative. Electronically Signed   By: Lovey Newcomer M.D.   On: 02/26/2020 10:49   MS DIGITAL SCREENING TOMO BILATERAL  Result Date: 02/13/2019 CLINICAL DATA:  Screening. EXAM: DIGITAL SCREENING BILATERAL MAMMOGRAM WITH TOMO AND CAD COMPARISON:  Previous exam(s). ACR Breast Density Category c: The breast tissue is heterogeneously dense, which may obscure small masses. FINDINGS: There are no findings suspicious for malignancy. Images were  processed with CAD. IMPRESSION: No mammographic evidence of malignancy. A result letter of this screening mammogram will be mailed directly to  the patient. RECOMMENDATION: Screening mammogram in one year. (Code:SM-B-01Y) BI-RADS CATEGORY  1: Negative. Electronically Signed   By: Kristopher Oppenheim M.D.   On: 02/13/2019 16:41   MM DIAG BREAST TOMO BILATERAL  Result Date: 09/21/2017 CLINICAL DATA:  Left lumpectomy.  Annual mammography. EXAM: DIGITAL DIAGNOSTIC BILATERAL MAMMOGRAM WITH CAD AND TOMO COMPARISON:  Previous exam(s). ACR Breast Density Category b: There are scattered areas of fibroglandular density. FINDINGS: No masses, calcifications, or suspicious distortion. Mammographic images were processed with CAD. IMPRESSION: No mammographic evidence of malignancy. RECOMMENDATION: Annual screening mammography. I have discussed the findings and recommendations with the patient. Results were also provided in writing at the conclusion of the visit. If applicable, a reminder letter will be sent to the patient regarding the next appointment. BI-RADS CATEGORY  2: Benign. Electronically Signed   By: Dorise Bullion III M.D   On: 09/21/2017 08:53      Pelvic/Bimanual Pap is not indicated today    Smoking History: Patient has never smoked and was not referred to quit line.    Patient Navigation: Patient education provided. Access to services provided for patient through [facility] program. No interpreter provided. No transportation provided   Colorectal Cancer Screening: Per patient has had colonoscopy completed on 02/21/2016 with benign findings.   No complaints today.    Breast and Cervical Cancer Risk Assessment: Patient does not have family history of breast cancer, known genetic mutations, or radiation treatment to the chest before age 74. Patient does not have history of cervical dysplasia, immunocompromised, or DES exposure in-utero.  Risk Assessment     Risk Scores       05/08/2022   Last edited by:  Demetrius Revel, LPN   5-year risk: 1.9 %   Lifetime risk: 8.1 %            A: [facility] exam without pap smear No complaints with benign exam. Possible distortion or mass noted on right breast, screening mammogram.   P: [facility]. Appointment scheduled 05/12/22.  Dayton Scrape A, NP 05/08/2022 11:08 AM

## 2022-05-08 NOTE — Patient Instructions (Signed)
Taught Brenda Olsen about self breast awareness. Patient does not remember last pap smear and declines testing today. Told patient about free cervical cancer screenings to receive a Pap smear if would like one next year. Let her know [facility] will cover Pap smears every 3 years unless has a history of abnormal Pap smears. Referred patient to the [facility] for diagnostic mammogram. Appointment scheduled for 05/12/22. Patient aware of appointment and will be there. Let patient know will follow up with her within the next couple weeks with results. Brenda Olsen verbalized understanding.  Melodye Ped, NP 11:19 AM

## 2022-05-12 ENCOUNTER — Ambulatory Visit (HOSPITAL_COMMUNITY)
Admission: RE | Admit: 2022-05-12 | Discharge: 2022-05-12 | Disposition: A | Discharge status: Home / Self Care | Payer: Self-pay | Source: Ambulatory Visit | Attending: Obstetrics and Gynecology | Admitting: Obstetrics and Gynecology

## 2022-05-12 DIAGNOSIS — R928 Other abnormal and inconclusive findings on diagnostic imaging of breast: Secondary | ICD-10-CM | POA: Insufficient documentation

## 2022-07-08 ENCOUNTER — Ambulatory Visit: Payer: Self-pay | Admitting: Physician Assistant

## 2022-07-08 DIAGNOSIS — J069 Acute upper respiratory infection, unspecified: Secondary | ICD-10-CM

## 2022-07-08 MED ORDER — BENZONATATE 100 MG PO CAPS: ORAL_CAPSULE | ORAL | 2 refills | Status: DC

## 2022-07-08 MED ORDER — ALBUTEROL SULFATE HFA 108 (90 BASE) MCG/ACT IN AERS: 2.0000 | INHALATION_SPRAY | Freq: Four times a day (QID) | RESPIRATORY_TRACT | 0 refills | Status: DC | PRN

## 2022-07-08 MED ORDER — FLUTICASONE-SALMETEROL 230-21 MCG/ACT IN AERO: 2.0000 | INHALATION_SPRAY | Freq: Two times a day (BID) | RESPIRATORY_TRACT | 0 refills | Status: DC

## 2022-07-08 MED ORDER — ALBUTEROL SULFATE HFA 108 (90 BASE) MCG/ACT IN AERS: 2.0000 | INHALATION_SPRAY | Freq: Four times a day (QID) | RESPIRATORY_TRACT | 3 refills | Status: DC | PRN

## 2022-07-08 NOTE — Progress Notes (Signed)
LMP 07/20/2006    Subjective:    Patient ID: Brenda Olsen, female    DOB: Nov 07, 1960, 61 y.o.   MRN: [id number]  HPI: Brenda Olsen is a 61 y.o. female presenting on 07/08/2022 for No chief complaint on file.   HPI  This is a telemedicine appointment.  Pt connected through [employer] but was unable to get her volume to work so appointment was changed to telephone.  I connected with  Georganna Janssens on 07/08/22 by a video enabled telemedicine application and verified that I am speaking with the correct person using two identifiers.   I discussed the limitations of evaluation and management by telemedicine. The patient expressed understanding and agreed to proceed.  Pt is at home.  Provider is at office.    Pt is Not feeling well.  She started feeling bad on Monday.  She says she was well when she went to be and when she awakened, she felt terrible.  Her Worst symtpom is cough, fever, laryngitis.  A little bit of wheezing and sob.  Fever was to 101.  Cough is a lot.   She is using sudafed.  It helps until it wears off.   She is having body aches  She did not get a flu shot this year.  She took covid test yesterday that was negative. .      Relevant past medical, surgical, family and social history reviewed and updated as indicated. Interim medical history since our last visit reviewed. Allergies and medications reviewed and updated.   Current Outpatient Medications:    albuterol (VENTOLIN HFA) 108 (90 Base) MCG/ACT inhaler, Inhale 2 puffs into the lungs every 6 (six) hours as needed for wheezing or shortness of breath., Disp: 3 each, Rfl: 3   fluticasone-salmeterol (ADVAIR HFA) 230-21 MCG/ACT inhaler, Inhale 2 puffs into the lungs 2 (two) times daily., Disp: 3 each, Rfl: 3   diphenhydrAMINE (BENADRYL) 25 MG tablet, Take 25 mg by mouth every 6 (six) hours as needed for itching or allergies., Disp: , Rfl:    omeprazole (PRILOSEC) 20 MG capsule, Take 1 capsule (20 mg total) by mouth daily  as needed., Disp: 90 capsule, Rfl: 1   simvastatin (ZOCOR) 20 MG tablet, Take 1 tablet (20 mg total) by mouth at bedtime., Disp: 30 tablet, Rfl: 11    Review of Systems  Per HPI unless specifically indicated above     Objective:    LMP 07/20/2006   Wt Readings from Last 3 Encounters:  05/08/22 151 lb 12.8 oz (68.9 kg)  02/04/22 147 lb 14.4 oz (67.1 kg)  02/11/21 148 lb (67.1 kg)    Physical Exam Constitutional:      General: She is not in acute distress.    Appearance: She is ill-appearing. She is not toxic-appearing.  HENT:     Head: Normocephalic and atraumatic.  Pulmonary:     Effort: Pulmonary effort is normal. No respiratory distress.     Comments: Pt is talking in complete sentences without stopping for dyspnea.  Neurological:     Mental Status: She is alert and oriented to person, place, and time.  Psychiatric:        Behavior: Behavior normal.           Assessment & Plan:    Encounter Diagnosis  Name Primary?   Upper respiratory tract infection, unspecified type Yes     -discussed with pt Likely influenza in light of negative covid test, acute onset of symptoms, her constellation of symptoms.  She is to Use regularly her inhaler. OTCs like APAP or IBU, sudafed, mucinex as needed.  -Rx tessalon-  and albuterol mdi (to make sure she doesn't run out) -She is staying in town for holidays.   -note to be out of work this week will be sent to her via her email -pt was encouraged to go to ER if she started having problems with her breathing. She states understanding

## 2022-07-16 ENCOUNTER — Other Ambulatory Visit: Payer: Self-pay | Admitting: Physician Assistant

## 2022-07-16 DIAGNOSIS — E785 Hyperlipidemia, unspecified: Secondary | ICD-10-CM

## 2022-07-21 ENCOUNTER — Ambulatory Visit (INDEPENDENT_AMBULATORY_CARE_PROVIDER_SITE_OTHER): Payer: Self-pay

## 2022-07-21 ENCOUNTER — Encounter: Payer: Self-pay | Admitting: Emergency Medicine

## 2022-07-21 ENCOUNTER — Ambulatory Visit
Admission: EM | Admit: 2022-07-21 | Discharge: 2022-07-21 | Disposition: A | Discharge status: Home / Self Care | Payer: Self-pay | Attending: Urgent Care | Admitting: Urgent Care

## 2022-07-21 DIAGNOSIS — B349 Viral infection, unspecified: Secondary | ICD-10-CM

## 2022-07-21 DIAGNOSIS — R0782 Intercostal pain: Secondary | ICD-10-CM

## 2022-07-21 DIAGNOSIS — S2231XA Fracture of one rib, right side, initial encounter for closed fracture: Secondary | ICD-10-CM

## 2022-07-21 DIAGNOSIS — J4530 Mild persistent asthma, uncomplicated: Secondary | ICD-10-CM

## 2022-07-21 DIAGNOSIS — R059 Cough, unspecified: Secondary | ICD-10-CM

## 2022-07-21 MED ORDER — NAPROXEN 500 MG PO TABS: 500.0000 mg | ORAL_TABLET | Freq: Two times a day (BID) | ORAL | 0 refills | Status: DC

## 2022-07-21 MED ORDER — CYCLOBENZAPRINE HCL 5 MG PO TABS: 5.0000 mg | ORAL_TABLET | Freq: Every evening | ORAL | 0 refills | Status: DC | PRN

## 2022-07-21 MED ORDER — PROMETHAZINE-DM 6.25-15 MG/5ML PO SYRP: 2.5000 mL | ORAL_SOLUTION | Freq: Three times a day (TID) | ORAL | 0 refills | Status: DC | PRN

## 2022-07-21 MED ORDER — PREDNISONE 50 MG PO TABS: 50.0000 mg | ORAL_TABLET | Freq: Every day | ORAL | 0 refills | Status: DC

## 2022-07-21 NOTE — ED Provider Notes (Signed)
[facility]  Note:  This document was prepared using Dragon voice recognition software and may include unintentional dictation errors.  MRN: [id number] DOB: 02-17-1961  Subjective:   Brenda Olsen is a 62 y.o. female presenting for 5-6 day history of acute onset persistent coughing, sinus drainage, wheezing and shortness of breath.  Patient did a video visit and was advised that she had the flu, but was recommended to use supportive care.  She has continued to have symptoms and unfortunately has started having severe right-sided lateral chest wall/rib pain.  Has a history of chronic asthma.  Patient is not a smoker.  No current facility-administered medications for this encounter.  Current Outpatient Medications:    albuterol (VENTOLIN HFA) 108 (90 Base) MCG/ACT inhaler, Inhale 2 puffs into the lungs every 6 (six) hours as needed for wheezing or shortness of breath., Disp: 3 each, Rfl: 0   fluticasone-salmeterol (ADVAIR HFA) 230-21 MCG/ACT inhaler, Inhale 2 puffs into the lungs 2 (two) times daily., Disp: 3 each, Rfl: 0   omeprazole (PRILOSEC) 20 MG capsule, Take 1 capsule (20 mg total) by mouth daily as needed., Disp: 90 capsule, Rfl: 1   simvastatin (ZOCOR) 20 MG tablet, Take 1 tablet (20 mg total) by mouth at bedtime., Disp: 30 tablet, Rfl: 11   benzonatate (TESSALON PERLES) 100 MG capsule, 1-2 po q 8 hour prn cough, Disp: 20 capsule, Rfl: 2   diphenhydrAMINE (BENADRYL) 25 MG tablet, Take 25 mg by mouth every 6 (six) hours as needed for itching or allergies., Disp: , Rfl:    Allergies  Allergen Reactions   Latex Shortness Of Breath   Sulfa Antibiotics Hives    Past Medical History:  Diagnosis Date   Asthma    Cancer ([place]) 2011   breast   Hyperlipidemia    Personal history of radiation therapy 2012     Past Surgical History:  Procedure Laterality Date   BREAST LUMPECTOMY Left 2012   BREAST SURGERY Left 2012   lumpectomy- followed by radiation x 6 wk    COLONOSCOPY  age 80   COLONOSCOPY N/A 02/21/2016   Procedure: COLONOSCOPY;  Surgeon: Danie Binder, MD;  Location: [facility];  Service: Endoscopy;  Laterality: N/A;  1200   ESOPHAGOGASTRODUODENOSCOPY N/A 02/21/2016   Procedure: ESOPHAGOGASTRODUODENOSCOPY (EGD);  Surgeon: Danie Binder, MD;  Location: [facility];  Service: Endoscopy;  Laterality: N/A;   KNEE ARTHROSCOPY Bilateral 2001, 2003   LITHOTRIPSY  2011   SAVORY DILATION N/A 02/21/2016   Procedure: SAVORY DILATION;  Surgeon: Danie Binder, MD;  Location: [facility];  Service: Endoscopy;  Laterality: N/A;   THIGH / KNEE SOFT TISSUE BIOPSY Right 62 yo   benign tumor removed L thigh   TONSILLECTOMY  childhood   VARICOSE VEIN SURGERY Right 2000    Family History  Problem Relation Age of Onset   Scleroderma Mother    Lupus Mother    Heart disease Mother    Alzheimer's disease Father    Heart disease Brother    Stroke Brother    Cancer Brother        lung   Colon cancer Neg Hx     Social History   Tobacco Use   Smoking status: Never   Smokeless tobacco: Never  Vaping Use   Vaping Use: Never used  Substance Use Topics   Alcohol use: No   Drug use: No    ROS   Objective:   Vitals: BP 104/71 (BP Location: Right  Arm)   Pulse (!) 101   Temp 99.2 F (37.3 C) (Oral)   Resp 18   Ht '5\' 2"'$  (1.575 m)   LMP 07/20/2006   SpO2 93%   BMI 27.76 kg/m   Physical Exam Constitutional:      General: She is not in acute distress.    Appearance: Normal appearance. She is well-developed. She is not ill-appearing, toxic-appearing or diaphoretic.  HENT:     Head: Normocephalic and atraumatic.     Nose: Nose normal.     Mouth/Throat:     Mouth: Mucous membranes are moist.  Eyes:     General: No scleral icterus.       Right eye: No discharge.        Left eye: No discharge.     Extraocular Movements: Extraocular movements intact.  Cardiovascular:     Rate and Rhythm: Normal rate and regular rhythm.     Heart sounds:  Normal heart sounds. No murmur heard.    No friction rub. No gallop.  Pulmonary:     Effort: Pulmonary effort is normal. No respiratory distress.     Breath sounds: No stridor. Wheezing present. No rhonchi or rales.  Chest:     Chest wall: Tenderness (right lateral ribs on lower side) present.  Skin:    General: Skin is warm and dry.  Neurological:     General: No focal deficit present.     Mental Status: She is alert and oriented to person, place, and time.  Psychiatric:        Mood and Affect: Mood normal.        Behavior: Behavior normal.     DG Ribs Unilateral Right  Result Date: 07/21/2022 CLINICAL DATA:  Possible right rib fracture. EXAM: RIGHT RIBS - 2 VIEW COMPARISON:  None Available. FINDINGS: Mildly displaced fracture is seen involving lateral portion of right eighth rib. IMPRESSION: Mildly displaced right eighth rib fracture. Electronically Signed   By: Marijo Conception M.D.   On: 07/21/2022 13:12   DG Chest 2 View  Result Date: 07/21/2022 CLINICAL DATA:  Right rib pain, cough. EXAM: CHEST - 2 VIEW COMPARISON:  None Available. FINDINGS: The heart size and mediastinal contours are within normal limits. Both lungs are clear. Possible minimally displaced right eighth rib fracture. IMPRESSION: No active cardiopulmonary disease. Possible minimally displaced right eighth rib fracture. Electronically Signed   By: Marijo Conception M.D.   On: 07/21/2022 13:10     Assessment and Plan :   PDMP not reviewed this encounter.  1. Acute viral syndrome   2. Mild persistent chronic asthma without complication   3. Closed fracture of one rib of right side, initial encounter     Recommended general management for possible mild eighth rib fracture.  In the context of her asthma and current illness, wheezing recommended oral prednisone course followed by naproxen.  Use Flexeril as a muscle relaxant for the chest wall.  Continue with supportive care otherwise for an acute viral syndrome.   Counseled patient on potential for adverse effects with medications prescribed/recommended today, ER and return-to-clinic precautions discussed, patient verbalized understanding.    Jaynee Eagles, PA-C 07/21/22 1329

## 2022-07-21 NOTE — ED Triage Notes (Signed)
Patient c/o possible rib fracture on her right side from coughing for several days.  Patient was diagnosed with influenza, given cough meds.  Some SOB.  Patient has taken Tylenol.

## 2022-08-04 ENCOUNTER — Other Ambulatory Visit: Payer: Self-pay | Admitting: Physician Assistant

## 2022-08-04 MED ORDER — FLUTICASONE-SALMETEROL 250-50 MCG/ACT IN AEPB: 1.0000 | INHALATION_SPRAY | Freq: Two times a day (BID) | RESPIRATORY_TRACT | 0 refills | Status: DC

## 2022-08-05 ENCOUNTER — Ambulatory Visit: Payer: Self-pay | Admitting: Physician Assistant

## 2022-08-14 ENCOUNTER — Other Ambulatory Visit (HOSPITAL_COMMUNITY)
Admission: RE | Admit: 2022-08-14 | Discharge: 2022-08-14 | Disposition: A | Discharge status: Home / Self Care | Payer: Self-pay | Source: Ambulatory Visit | Attending: Physician Assistant | Admitting: Physician Assistant

## 2022-08-14 DIAGNOSIS — E785 Hyperlipidemia, unspecified: Secondary | ICD-10-CM | POA: Insufficient documentation

## 2022-08-14 LAB — LIPID PANEL
Cholesterol: 193 mg/dL (ref 0–200)
HDL: 62 mg/dL (ref >40)
LDL Cholesterol: 118 mg/dL — ABNORMAL HIGH (ref 0–99)
Total CHOL/HDL Ratio: 3.1 RATIO
Triglycerides: 66 mg/dL (ref <150)
VLDL: 13 mg/dL (ref 0–40)

## 2022-08-14 LAB — COMPREHENSIVE METABOLIC PANEL
ALT: 35 U/L (ref 0–44)
AST: 32 U/L (ref 15–41)
Albumin: 3.6 g/dL (ref 3.5–5.0)
Alkaline Phosphatase: 90 U/L (ref 38–126)
Anion gap: 7 (ref 5–15)
BUN: 19 mg/dL (ref 8–23)
CO2: 22 mmol/L (ref 22–32)
Calcium: 8.9 mg/dL (ref 8.9–10.3)
Chloride: 107 mmol/L (ref 98–111)
Creatinine, Ser: 0.85 mg/dL (ref 0.44–1.00)
GFR, Estimated: >60 mL/min (ref >60)
Glucose, Bld: 103 mg/dL — ABNORMAL HIGH (ref 70–99)
Potassium: 4 mmol/L (ref 3.5–5.1)
Sodium: 136 mmol/L (ref 135–145)
Total Bilirubin: 1.5 mg/dL — ABNORMAL HIGH (ref 0.3–1.2)
Total Protein: 6.8 g/dL (ref 6.5–8.1)

## 2022-08-17 ENCOUNTER — Ambulatory Visit: Payer: Self-pay | Admitting: Physician Assistant

## 2022-08-17 ENCOUNTER — Encounter: Payer: Self-pay | Admitting: Physician Assistant

## 2022-08-17 VITALS — BP 112/68 | HR 89 | Temp 97.2°F | Wt 152.0 lb

## 2022-08-17 DIAGNOSIS — E785 Hyperlipidemia, unspecified: Secondary | ICD-10-CM

## 2022-08-17 DIAGNOSIS — J45909 Unspecified asthma, uncomplicated: Secondary | ICD-10-CM

## 2022-08-17 DIAGNOSIS — Z13820 Encounter for screening for osteoporosis: Secondary | ICD-10-CM

## 2022-08-17 DIAGNOSIS — Z8781 Personal history of (healed) traumatic fracture: Secondary | ICD-10-CM

## 2022-08-17 NOTE — Progress Notes (Signed)
BP 112/68   Pulse 89   Temp (!) 97.2 F (36.2 C)   Wt 152 lb (68.9 kg)   LMP 07/20/2006   SpO2 95%   BMI 27.80 kg/m    Subjective:    Patient ID: Brenda Olsen, female    DOB: Apr 03, 1961, 62 y.o.   MRN: [id number]  HPI: Brenda Olsen is a 62 y.o. female presenting on 08/17/2022 for Asthma and Hyperlipidemia   HPI  Chief Complaint  Patient presents with   Asthma   Hyperlipidemia      She broke her rib from coughing recently.  She was sick.  She is all better now.   The rib fracture still hurts a bit.     Relevant past medical, surgical, family and social history reviewed and updated as indicated. Interim medical history since our last visit reviewed. Allergies and medications reviewed and updated.    CURRENT MEDS: Simvastatin Advair albuterol  Review of Systems  Per HPI unless specifically indicated above     Objective:    BP 112/68   Pulse 89   Temp (!) 97.2 F (36.2 C)   Wt 152 lb (68.9 kg)   LMP 07/20/2006   SpO2 95%   BMI 27.80 kg/m   Wt Readings from Last 3 Encounters:  08/17/22 152 lb (68.9 kg)  05/08/22 151 lb 12.8 oz (68.9 kg)  02/04/22 147 lb 14.4 oz (67.1 kg)    Physical Exam Vitals reviewed.  Constitutional:      General: She is not in acute distress.    Appearance: She is well-developed. She is not ill-appearing.  HENT:     Head: Normocephalic and atraumatic.  Cardiovascular:     Rate and Rhythm: Normal rate and regular rhythm.  Pulmonary:     Effort: Pulmonary effort is normal.     Breath sounds: Normal breath sounds.  Abdominal:     General: Bowel sounds are normal.     Palpations: Abdomen is soft. There is no mass.     Tenderness: There is no abdominal tenderness.  Musculoskeletal:     Cervical back: Neck supple.     Right lower leg: No edema.     Left lower leg: No edema.  Lymphadenopathy:     Cervical: No cervical adenopathy.  Skin:    General: Skin is warm and dry.  Neurological:     Mental Status: She is alert  and oriented to person, place, and time.  Psychiatric:        Behavior: Behavior normal.     Results for orders placed or performed during the hospital encounter of 08/14/22  Lipid panel  Result Value Ref Range   Cholesterol 193 0 - 200 mg/dL   Triglycerides 66 <150 mg/dL   HDL 62 >40 mg/dL   Total CHOL/HDL Ratio 3.1 RATIO   VLDL 13 0 - 40 mg/dL   LDL Cholesterol 118 (H) 0 - 99 mg/dL  Comprehensive metabolic panel  Result Value Ref Range   Sodium 136 135 - 145 mmol/L   Potassium 4.0 3.5 - 5.1 mmol/L   Chloride 107 98 - 111 mmol/L   CO2 22 22 - 32 mmol/L   Glucose, Bld 103 (H) 70 - 99 mg/dL   BUN 19 8 - 23 mg/dL   Creatinine, Ser 0.85 0.44 - 1.00 mg/dL   Calcium 8.9 8.9 - 10.3 mg/dL   Total Protein 6.8 6.5 - 8.1 g/dL   Albumin 3.6 3.5 - 5.0 g/dL   AST 32 15 -  41 U/L   ALT 35 0 - 44 U/L   Alkaline Phosphatase 90 38 - 126 U/L   Total Bilirubin 1.5 (H) 0.3 - 1.2 mg/dL   GFR, Estimated >60 >60 mL/min   Anion gap 7 5 - 15      Assessment & Plan:     Encounter Diagnoses  Name Primary?   Asthma, chronic, unspecified asthma severity, uncomplicated Yes   Hyperlipidemia, unspecified hyperlipidemia type    History of rib fracture    Screening for osteoporosis      Recent rib fracture/screen for osteoporosis DEXA- due to recent rib fracture and she is a petite white female -pt is given [facility]/application for [employer] financial assistance -recommend she start OTC calcium with vitamin d supplement  Dyslipidemia -reviewed labs with pt -pt to continue simvastatin.  LDL likely up due to decreased activity level past 6 weeks or so  Asthma -stable. Continue fluticasone/salmeterol and albuterol   Pt to follow up 3 months. She is to contact office sooner prn

## 2022-08-24 ENCOUNTER — Other Ambulatory Visit (HOSPITAL_COMMUNITY): Payer: Self-pay

## 2022-08-26 ENCOUNTER — Other Ambulatory Visit: Payer: Self-pay | Admitting: Physician Assistant

## 2022-08-26 ENCOUNTER — Ambulatory Visit (HOSPITAL_COMMUNITY)
Admission: RE | Admit: 2022-08-26 | Discharge: 2022-08-26 | Disposition: A | Discharge status: Home / Self Care | Payer: Self-pay | Source: Ambulatory Visit | Attending: Physician Assistant | Admitting: Physician Assistant

## 2022-08-26 DIAGNOSIS — Z13820 Encounter for screening for osteoporosis: Secondary | ICD-10-CM | POA: Insufficient documentation

## 2022-08-26 DIAGNOSIS — Z8781 Personal history of (healed) traumatic fracture: Secondary | ICD-10-CM | POA: Insufficient documentation

## 2022-08-26 MED ORDER — ALENDRONATE SODIUM 70 MG PO TABS: 70.0000 mg | ORAL_TABLET | ORAL | 1 refills | Status: DC

## 2022-11-12 ENCOUNTER — Other Ambulatory Visit: Payer: Self-pay | Admitting: Physician Assistant

## 2022-11-16 ENCOUNTER — Ambulatory Visit: Payer: Self-pay | Admitting: Physician Assistant

## 2022-11-16 ENCOUNTER — Encounter: Payer: Self-pay | Admitting: Physician Assistant

## 2022-11-16 VITALS — BP 127/68 | HR 98 | Temp 97.9°F | Wt 151.5 lb

## 2022-11-16 DIAGNOSIS — J45909 Unspecified asthma, uncomplicated: Secondary | ICD-10-CM

## 2022-11-16 DIAGNOSIS — S76212A Strain of adductor muscle, fascia and tendon of left thigh, initial encounter: Secondary | ICD-10-CM

## 2022-11-16 DIAGNOSIS — M810 Age-related osteoporosis without current pathological fracture: Secondary | ICD-10-CM

## 2022-11-16 DIAGNOSIS — E785 Hyperlipidemia, unspecified: Secondary | ICD-10-CM

## 2022-11-16 MED ORDER — IBANDRONATE SODIUM 150 MG PO TABS: 150.0000 mg | ORAL_TABLET | ORAL | 1 refills | Status: DC

## 2022-11-16 NOTE — Patient Instructions (Addendum)
[facility]-  (with goodRx card)  Boniva (Ibandronate)  3 tabs- $31 Simvastatin-  30 tabs   $14   Boniva-  once a month on the same date each month, taken at least 60 minutes before the first food, beverage, or medicine of the day other than water

## 2022-11-16 NOTE — Progress Notes (Signed)
BP 127/68   Pulse 98   Temp 97.9 F (36.6 C)   Wt 151 lb 8 oz (68.7 kg)   LMP 07/20/2006   SpO2 97%   BMI 27.71 kg/m    Subjective:    Patient ID: Brenda Olsen, female    DOB: Mar 11, 1961, 62 y.o.   MRN: [id number]  HPI: Brenda Olsen is a 62 y.o. female presenting on 11/16/2022 for Asthma, Hypertension, and Osteoporosis    HPI   Chief Complaint  Patient presents with   Asthma   Hypertension   Osteoporosis     She tried the fosamax 2 or 3 times and then stopped it because it was making her nauseous.  She was taking it according to directions.    She wonders if she pulled a musled- left groin.  Bothering her about 3 weeks.  Hurts when she gets up, walks up a hill or bending over.  She has taken aleve.  She doesn't remember any injury.     dical, surgical, family and social history reviewed and updated as indicated. Interim medical history since our last visit reviewed. Allergies and medications reviewed and updated.    Current Outpatient Medications:    albuterol (VENTOLIN HFA) 108 (90 Base) MCG/ACT inhaler, Inhale 2 puffs into the lungs every 6 (six) hours as needed for wheezing or shortness of breath., Disp: 3 each, Rfl: 0   alendronate (FOSAMAX) 70 MG tablet, Take 1 tablet (70 mg total) by mouth every 7 (seven) days. Take with a full glass of water on an empty stomach. (Patient not taking: Reported on 11/16/2022), Disp: 12 tablet, Rfl: 1   CALCIUM-VITAMIN D PO, Take by mouth., Disp: , Rfl:    fluticasone-salmeterol (ADVAIR) 250-50 MCG/ACT AEPB, Inhale 1 puff into the lungs in the morning and at bedtime., Disp: 3 each, Rfl: 0   simvastatin (ZOCOR) 20 MG tablet, Take 1 tablet (20 mg total) by mouth at bedtime., Disp: 30 tablet, Rfl: 11   diphenhydrAMINE (BENADRYL) 25 MG tablet, Take 25 mg by mouth every 6 (six) hours as needed for itching or allergies. (Patient not taking: Reported on 08/17/2022), Disp: , Rfl:    omeprazole (PRILOSEC) 20 MG capsule, Take 1 capsule (20  mg total) by mouth daily as needed. (Patient not taking: Reported on 08/17/2022), Disp: 90 capsule, Rfl: 1    Review of Systems  Per HPI unless specifically indicated above     Objective:    BP 127/68   Pulse 98   Temp 97.9 F (36.6 C)   Wt 151 lb 8 oz (68.7 kg)   LMP 07/20/2006   SpO2 97%   BMI 27.71 kg/m   Wt Readings from Last 3 Encounters:  11/16/22 151 lb 8 oz (68.7 kg)  08/17/22 152 lb (68.9 kg)  05/08/22 151 lb 12.8 oz (68.9 kg)    Physical Exam Vitals reviewed.  Constitutional:      General: She is not in acute distress.    Appearance: She is well-developed. She is not toxic-appearing.  HENT:     Head: Normocephalic and atraumatic.  Cardiovascular:     Rate and Rhythm: Normal rate and regular rhythm.     Pulses:          Femoral pulses are 2+ on the right side and 2+ on the left side. Pulmonary:     Effort: Pulmonary effort is normal.     Breath sounds: Normal breath sounds.  Abdominal:     General: Bowel sounds are normal.  Palpations: Abdomen is soft. There is no mass.     Tenderness: There is no abdominal tenderness.     Hernia: No hernia is present.  Musculoskeletal:     Cervical back: Neck supple.     Lumbar back: Negative right straight leg raise test and negative left straight leg raise test.     Right hip: Normal range of motion.     Left hip: Tenderness present. No deformity or bony tenderness. Normal range of motion.     Left upper leg: Normal.     Right lower leg: No edema.     Left lower leg: No edema.     Comments: Tenderness soft tissue left groin  Lymphadenopathy:     Cervical: No cervical adenopathy.  Skin:    General: Skin is warm and dry.  Neurological:     Mental Status: She is alert and oriented to person, place, and time.  Psychiatric:        Behavior: Behavior normal.           Assessment & Plan:   Encounter Diagnoses  Name Primary?   Osteoporosis, unspecified osteoporosis type, unspecified pathological fracture  presence Yes   Asthma, chronic, unspecified asthma severity, uncomplicated    Hyperlipidemia, unspecified hyperlipidemia type    Inguinal strain, left, initial encounter       -Trial of boniva -Gave good rx card.   -counseled on care of groin strain- avoiding overexertion, aleve, ice -continue simvastatin, albuterol, advair, calcium with vitamin D -mammogram and colonoscopy UTD.  Pt declines PAP -pt to follow up 3 months.  She is to contact office sooner prn

## 2023-01-04 ENCOUNTER — Other Ambulatory Visit: Payer: Self-pay | Admitting: Physician Assistant

## 2023-01-25 ENCOUNTER — Other Ambulatory Visit: Payer: Self-pay | Admitting: Physician Assistant

## 2023-01-25 DIAGNOSIS — E785 Hyperlipidemia, unspecified: Secondary | ICD-10-CM

## 2023-02-12 ENCOUNTER — Other Ambulatory Visit (HOSPITAL_COMMUNITY)
Admission: RE | Admit: 2023-02-12 | Discharge: 2023-02-12 | Disposition: A | Discharge status: Home / Self Care | Payer: Self-pay | Source: Ambulatory Visit | Attending: Physician Assistant | Admitting: Physician Assistant

## 2023-02-12 DIAGNOSIS — E785 Hyperlipidemia, unspecified: Secondary | ICD-10-CM | POA: Insufficient documentation

## 2023-02-12 LAB — HEPATIC FUNCTION PANEL
ALT: 39 U/L (ref 0–44)
AST: 33 U/L (ref 15–41)
Albumin: 3.8 g/dL (ref 3.5–5.0)
Alkaline Phosphatase: 77 U/L (ref 38–126)
Bilirubin, Direct: 0.2 mg/dL (ref 0.0–0.2)
Indirect Bilirubin: 1.5 mg/dL — ABNORMAL HIGH (ref 0.3–0.9)
Total Bilirubin: 1.7 mg/dL — ABNORMAL HIGH (ref 0.3–1.2)
Total Protein: 6.8 g/dL (ref 6.5–8.1)

## 2023-02-12 LAB — LIPID PANEL
Cholesterol: 171 mg/dL (ref 0–200)
HDL: 58 mg/dL (ref >40)
LDL Cholesterol: 95 mg/dL (ref 0–99)
Total CHOL/HDL Ratio: 2.9 RATIO
Triglycerides: 90 mg/dL (ref <150)
VLDL: 18 mg/dL (ref 0–40)

## 2023-02-15 ENCOUNTER — Encounter: Payer: Self-pay | Admitting: *Deleted

## 2023-02-15 ENCOUNTER — Ambulatory Visit: Payer: Self-pay | Admitting: Physician Assistant

## 2023-02-15 ENCOUNTER — Encounter (INDEPENDENT_AMBULATORY_CARE_PROVIDER_SITE_OTHER): Payer: Self-pay | Admitting: *Deleted

## 2023-02-15 ENCOUNTER — Encounter: Payer: Self-pay | Admitting: Physician Assistant

## 2023-02-15 ENCOUNTER — Telehealth: Payer: Self-pay | Admitting: Internal Medicine

## 2023-02-15 VITALS — BP 102/64 | HR 75 | Temp 97.2°F | Wt 148.5 lb

## 2023-02-15 DIAGNOSIS — J45909 Unspecified asthma, uncomplicated: Secondary | ICD-10-CM

## 2023-02-15 DIAGNOSIS — M810 Age-related osteoporosis without current pathological fracture: Secondary | ICD-10-CM

## 2023-02-15 DIAGNOSIS — Z1239 Encounter for other screening for malignant neoplasm of breast: Secondary | ICD-10-CM

## 2023-02-15 DIAGNOSIS — E785 Hyperlipidemia, unspecified: Secondary | ICD-10-CM

## 2023-02-15 MED ORDER — SIMVASTATIN 20 MG PO TABS: 20.0000 mg | ORAL_TABLET | Freq: Every day | ORAL | 11 refills | Status: DC

## 2023-02-15 MED ORDER — IBANDRONATE SODIUM 150 MG PO TABS: 150.0000 mg | ORAL_TABLET | ORAL | 1 refills | Status: DC

## 2023-02-15 NOTE — Progress Notes (Signed)
BP 102/64   Pulse 75   Temp (!) 97.2 F (36.2 C)   Wt 148 lb 8 oz (67.4 kg)   LMP 07/20/2006   SpO2 96%   BMI 27.16 kg/m    Subjective:    Patient ID: Brenda Olsen, female    DOB: 10-11-1960, 62 y.o.   MRN: [id number]  HPI: Brenda Olsen is a 62 y.o. female presenting on 02/15/2023 for Asthma, Hyperlipidemia, and Osteoporosis   HPI    Chief Complaint  Patient presents with   Asthma   Hyperlipidemia   Osteoporosis    Pt is 62yoF who is in today for routine follow up.   She says she is doing fine.  She has some stress due to her job as her hours have been cut and are inconsistent.  She is hoping to get a more consistent job.  She has some stress due to her mother's health issues (she has CREST syndrome and they are discussing feeding tube).  She says other than those things, she is doing well.  Her asthma is stable but she does have some issues if she cuts the grass.  She is doing fine with taking her boniva.     Relevant past medical, surgical, family and social history reviewed and updated as indicated. Interim medical history since our last visit reviewed. Allergies and medications reviewed and updated.   Current Outpatient Medications:    albuterol (VENTOLIN HFA) 108 (90 Base) MCG/ACT inhaler, INHALE 2 PUFFS BY MOUTH EVERY 6 HOURS AS NEEDED FOR COUGHING, WHEEZING, OR SHORTNESS OF BREATH, Disp: 20.1 g, Rfl: 0   CALCIUM-VITAMIN D PO, Take by mouth., Disp: , Rfl:    diphenhydrAMINE (BENADRYL) 25 MG tablet, Take 25 mg by mouth every 6 (six) hours as needed for itching or allergies., Disp: , Rfl:    fluticasone-salmeterol (ADVAIR) 250-50 MCG/ACT AEPB, Inhale 1 puff into the lungs in the morning and at bedtime., Disp: 3 each, Rfl: 0   ibandronate (BONIVA) 150 MG tablet, Take 1 tablet (150 mg total) by mouth every 30 (thirty) days. Take in the morning with a full glass of water, on an empty stomach, and do not take anything else by mouth or lie down for the next 30 min., Disp: 3  tablet, Rfl: 1   omeprazole (PRILOSEC) 20 MG capsule, Take 1 capsule (20 mg total) by mouth daily as needed., Disp: 90 capsule, Rfl: 1   simvastatin (ZOCOR) 20 MG tablet, Take 1 tablet (20 mg total) by mouth at bedtime., Disp: 30 tablet, Rfl: 11    Review of Systems  Per HPI unless specifically indicated above     Objective:    BP 102/64   Pulse 75   Temp (!) 97.2 F (36.2 C)   Wt 148 lb 8 oz (67.4 kg)   LMP 07/20/2006   SpO2 96%   BMI 27.16 kg/m   Wt Readings from Last 3 Encounters:  02/15/23 148 lb 8 oz (67.4 kg)  11/16/22 151 lb 8 oz (68.7 kg)  08/17/22 152 lb (68.9 kg)    Physical Exam Vitals reviewed.  Constitutional:      General: She is not in acute distress.    Appearance: She is well-developed. She is not toxic-appearing.  HENT:     Head: Normocephalic and atraumatic.  Cardiovascular:     Rate and Rhythm: Normal rate and regular rhythm.  Pulmonary:     Effort: Pulmonary effort is normal.     Breath sounds: Normal breath sounds.  Abdominal:     General: Bowel sounds are normal.     Palpations: Abdomen is soft. There is no mass.     Tenderness: There is no abdominal tenderness.  Musculoskeletal:     Cervical back: Neck supple.     Right lower leg: No edema.     Left lower leg: No edema.  Lymphadenopathy:     Cervical: No cervical adenopathy.  Skin:    General: Skin is warm and dry.  Neurological:     Mental Status: She is alert and oriented to person, place, and time.     Motor: No weakness or tremor.     Gait: Gait is intact. Gait normal.  Psychiatric:        Attention and Perception: Attention normal.        Mood and Affect: Mood normal.        Speech: Speech normal.        Behavior: Behavior normal. Behavior is cooperative.     Results for orders placed or performed during the hospital encounter of 02/12/23  Hepatic function panel  Result Value Ref Range   Total Protein 6.8 6.5 - 8.1 g/dL   Albumin 3.8 3.5 - 5.0 g/dL   AST 33 15 - 41 U/L    ALT 39 0 - 44 U/L   Alkaline Phosphatase 77 38 - 126 U/L   Total Bilirubin 1.7 (H) 0.3 - 1.2 mg/dL   Bilirubin, Direct 0.2 0.0 - 0.2 mg/dL   Indirect Bilirubin 1.5 (H) 0.3 - 0.9 mg/dL  Lipid panel  Result Value Ref Range   Cholesterol 171 0 - 200 mg/dL   Triglycerides 90 <161 mg/dL   HDL 58 >09 mg/dL   Total CHOL/HDL Ratio 2.9 RATIO   VLDL 18 0 - 40 mg/dL   LDL Cholesterol 95 0 - 99 mg/dL      Assessment & Plan:    Encounter Diagnoses  Name Primary?   Hyperlipidemia, unspecified hyperlipidemia type Yes   Asthma, chronic, unspecified asthma severity, uncomplicated    Osteoporosis, unspecified osteoporosis type, unspecified pathological fracture presence    Encounter for screening for malignant neoplasm of breast, unspecified screening modality      -Labs reviewed with pt -Looked like GI wanted to see her again- will contact [facility]  (contacted after pt left office and staff says they will contact pt directed to schedule her) -screening Mammogram ordered/[facility] -pt Declines PAP -pt to continue current medications -encouraged pt to increase exercise to most days of the week/five days -pt will follow up 6 months.  She is to contact office sooner prn

## 2023-02-15 NOTE — Telephone Encounter (Signed)
[facility] called because the patient had contacted them to say she hadn't heard from [place] about a recall for a colonoscopy.  Her last one was 2017 and hasn't heard back from [place].  I didn't see a recall for her other than an Est. 30 visit recall nothing for TCS.

## 2023-02-15 NOTE — Telephone Encounter (Signed)
Patient should have been placed on 5 yr TCS from her procedure in 2017 - not sure why she wasn't - I will print her TCS questionnaire - please mail for me. thanks

## 2023-02-22 ENCOUNTER — Other Ambulatory Visit: Payer: Self-pay | Admitting: Physician Assistant

## 2023-02-22 ENCOUNTER — Telehealth: Payer: Self-pay

## 2023-02-22 DIAGNOSIS — Z1231 Encounter for screening mammogram for malignant neoplasm of breast: Secondary | ICD-10-CM

## 2023-02-22 NOTE — Telephone Encounter (Signed)
Telephoned patient at mobile number. Left a message with person answering telephone. [facility]CP (scholarship)

## 2023-05-17 ENCOUNTER — Ambulatory Visit (HOSPITAL_COMMUNITY)
Admission: RE | Admit: 2023-05-17 | Discharge: 2023-05-17 | Disposition: A | Discharge status: Home / Self Care | Payer: Self-pay | Source: Ambulatory Visit | Attending: Physician Assistant | Admitting: Physician Assistant

## 2023-05-17 ENCOUNTER — Encounter (HOSPITAL_COMMUNITY): Payer: Self-pay

## 2023-05-17 DIAGNOSIS — Z1231 Encounter for screening mammogram for malignant neoplasm of breast: Secondary | ICD-10-CM | POA: Insufficient documentation

## 2023-06-07 ENCOUNTER — Other Ambulatory Visit: Payer: Self-pay | Admitting: Physician Assistant

## 2023-07-26 ENCOUNTER — Other Ambulatory Visit: Payer: Self-pay | Admitting: Physician Assistant

## 2023-07-26 DIAGNOSIS — E785 Hyperlipidemia, unspecified: Secondary | ICD-10-CM

## 2023-07-26 DIAGNOSIS — M810 Age-related osteoporosis without current pathological fracture: Secondary | ICD-10-CM

## 2023-08-11 ENCOUNTER — Telehealth: Payer: Self-pay

## 2023-08-11 NOTE — Telephone Encounter (Signed)
Attempted follow up and wellness call to [facility] client to determine if any needs that we may be able to assist her with and check in.  No answer, left vm requesting return call.  [facility] is current [facility] and next appointment is scheduled for 08/16/23   Francee Nodal RN Clara Gunn/[facility]

## 2023-08-13 ENCOUNTER — Other Ambulatory Visit (HOSPITAL_COMMUNITY)
Admission: RE | Admit: 2023-08-13 | Discharge: 2023-08-13 | Disposition: A | Discharge status: Home / Self Care | Payer: Self-pay | Source: Ambulatory Visit | Attending: Physician Assistant | Admitting: Physician Assistant

## 2023-08-13 DIAGNOSIS — E785 Hyperlipidemia, unspecified: Secondary | ICD-10-CM | POA: Insufficient documentation

## 2023-08-13 DIAGNOSIS — M810 Age-related osteoporosis without current pathological fracture: Secondary | ICD-10-CM | POA: Insufficient documentation

## 2023-08-13 LAB — COMPREHENSIVE METABOLIC PANEL
ALT: 67 U/L — ABNORMAL HIGH (ref 0–44)
AST: 56 U/L — ABNORMAL HIGH (ref 15–41)
Albumin: 3.9 g/dL (ref 3.5–5.0)
Alkaline Phosphatase: 79 U/L (ref 38–126)
Anion gap: 9 (ref 5–15)
BUN: 15 mg/dL (ref 8–23)
CO2: 25 mmol/L (ref 22–32)
Calcium: 9.4 mg/dL (ref 8.9–10.3)
Chloride: 104 mmol/L (ref 98–111)
Creatinine, Ser: 0.82 mg/dL (ref 0.44–1.00)
GFR, Estimated: >60 mL/min (ref >60)
Glucose, Bld: 98 mg/dL (ref 70–99)
Potassium: 4.4 mmol/L (ref 3.5–5.1)
Sodium: 138 mmol/L (ref 135–145)
Total Bilirubin: 2.1 mg/dL — ABNORMAL HIGH (ref 0.0–1.2)
Total Protein: 7.1 g/dL (ref 6.5–8.1)

## 2023-08-13 LAB — LIPID PANEL
Cholesterol: 194 mg/dL (ref 0–200)
HDL: 76 mg/dL (ref >40)
LDL Cholesterol: 102 mg/dL — ABNORMAL HIGH (ref 0–99)
Total CHOL/HDL Ratio: 2.6 {ratio}
Triglycerides: 80 mg/dL (ref <150)
VLDL: 16 mg/dL (ref 0–40)

## 2023-08-13 LAB — VITAMIN D 25 HYDROXY (VIT D DEFICIENCY, FRACTURES): Vit D, 25-Hydroxy: 29.61 ng/mL — ABNORMAL LOW (ref 30–100)

## 2023-08-16 ENCOUNTER — Ambulatory Visit: Payer: Self-pay | Admitting: Physician Assistant

## 2023-08-16 ENCOUNTER — Encounter: Payer: Self-pay | Admitting: Physician Assistant

## 2023-08-16 VITALS — BP 109/66 | HR 67 | Temp 97.8°F

## 2023-08-16 DIAGNOSIS — E559 Vitamin D deficiency, unspecified: Secondary | ICD-10-CM

## 2023-08-16 DIAGNOSIS — E785 Hyperlipidemia, unspecified: Secondary | ICD-10-CM

## 2023-08-16 DIAGNOSIS — M810 Age-related osteoporosis without current pathological fracture: Secondary | ICD-10-CM

## 2023-08-16 DIAGNOSIS — J45909 Unspecified asthma, uncomplicated: Secondary | ICD-10-CM

## 2023-08-16 MED ORDER — SIMVASTATIN 20 MG PO TABS: 20.0000 mg | ORAL_TABLET | Freq: Every day | ORAL | 3 refills | Status: AC

## 2023-08-16 MED ORDER — OMEPRAZOLE 20 MG PO CPDR: 20.0000 mg | DELAYED_RELEASE_CAPSULE | Freq: Every day | ORAL | 1 refills | Status: AC | PRN

## 2023-08-16 MED ORDER — ALBUTEROL SULFATE HFA 108 (90 BASE) MCG/ACT IN AERS: 2.0000 | INHALATION_SPRAY | Freq: Four times a day (QID) | RESPIRATORY_TRACT | 1 refills | Status: DC | PRN

## 2023-08-16 MED ORDER — ALENDRONATE SODIUM 70 MG PO TABS: 70.0000 mg | ORAL_TABLET | ORAL | 1 refills | Status: AC

## 2023-08-16 MED ORDER — FLUTICASONE FUROATE-VILANTEROL 100-25 MCG/ACT IN AEPB: 1.0000 | INHALATION_SPRAY | Freq: Every day | RESPIRATORY_TRACT | 1 refills | Status: DC

## 2023-08-16 MED ORDER — VITAMIN D3 125 MCG (5000 UT) PO CAPS: 5000.0000 [IU] | ORAL_CAPSULE | Freq: Every day | ORAL | Status: AC

## 2023-08-16 NOTE — Progress Notes (Signed)
BP 109/66   Pulse 67   Temp 97.8 F (36.6 C)   LMP 07/20/2006   SpO2 98%    Subjective:    Patient ID: Brenda Olsen, female    DOB: 1960/08/01, 63 y.o.   MRN: [id number]  HPI: Brenda Olsen is a 63 y.o. female presenting on 08/16/2023 for Asthma, Hyperlipidemia, and Osteoporosis   HPI  Chief Complaint  Patient presents with   Asthma   Hyperlipidemia   Osteoporosis     Pt says she is having a Stressful time at work.    She says her mother's doctors want to put her mom on feeding tube.  She is still mentally sharp.  She has CREST syndrome.   Pt has no complaints other than the stress.    Pt has osteoporosis with T score -2.7 per DEXA done 08/26/2022.  She is unsure what she is taking for her osteoporosis but says it comes in little boxes from [facility].    She had been started on fosamax but stopped it after 2 or 3 doses due to it made her nauseous.  She was given rx for boniva.   Relevant past medical, surgical, family and social history reviewed and updated as indicated. Interim medical history since our last visit reviewed. Allergies and medications reviewed and updated.   Current Outpatient Medications:    albuterol (VENTOLIN HFA) 108 (90 Base) MCG/ACT inhaler, INHALE 2 PUFFS BY MOUTH EVERY 6 HOURS AS NEEDED FOR COUGHING, WHEEZING, OR SHORTNESS OF BREATH, Disp: 25.5 g, Rfl: 1   CALCIUM-VITAMIN D PO, Take by mouth., Disp: , Rfl:    fluticasone-salmeterol (ADVAIR) 250-50 MCG/ACT AEPB, Inhale 1 puff into the lungs in the morning and at bedtime., Disp: 3 each, Rfl: 0   omeprazole (PRILOSEC) 20 MG capsule, Take 1 capsule (20 mg total) by mouth daily as needed., Disp: 90 capsule, Rfl: 1   simvastatin (ZOCOR) 20 MG tablet, Take 1 tablet (20 mg total) by mouth at bedtime., Disp: 30 tablet, Rfl: 11   diphenhydrAMINE (BENADRYL) 25 MG tablet, Take 25 mg by mouth every 6 (six) hours as needed for itching or allergies. (Patient not taking: Reported on 08/16/2023), Disp: , Rfl:     ibandronate (BONIVA) 150 MG tablet, Take 1 tablet (150 mg total) by mouth every 30 (thirty) days. Take in the morning with a full glass of water, on an empty stomach, and do not take anything else by mouth or lie down for the next 30 min. (Patient not taking: Reported on 08/16/2023), Disp: 3 tablet, Rfl: 1    Review of Systems  Per HPI unless specifically indicated above     Objective:    BP 109/66   Pulse 67   Temp 97.8 F (36.6 C)   LMP 07/20/2006   SpO2 98%   Wt Readings from Last 3 Encounters:  02/15/23 148 lb 8 oz (67.4 kg)  11/16/22 151 lb 8 oz (68.7 kg)  08/17/22 152 lb (68.9 kg)    Physical Exam Vitals reviewed.  Constitutional:      General: She is not in acute distress.    Appearance: She is well-developed. She is not toxic-appearing.  HENT:     Head: Normocephalic and atraumatic.  Eyes:     Extraocular Movements: Extraocular movements intact.     Pupils: Pupils are equal, round, and reactive to light.  Cardiovascular:     Rate and Rhythm: Normal rate and regular rhythm.  Pulmonary:     Effort: Pulmonary effort is normal.  Breath sounds: Normal breath sounds.  Abdominal:     General: Bowel sounds are normal.     Palpations: Abdomen is soft. There is no mass.     Tenderness: There is no abdominal tenderness.  Musculoskeletal:     Cervical back: Neck supple.     Right lower leg: No edema.     Left lower leg: No edema.  Lymphadenopathy:     Cervical: No cervical adenopathy.  Skin:    General: Skin is warm and dry.  Neurological:     Mental Status: She is alert and oriented to person, place, and time.  Psychiatric:        Behavior: Behavior normal.     Results for orders placed or performed during the hospital encounter of 08/13/23  Vitamin D (25 hydroxy)   Collection Time: 08/13/23  9:02 AM  Result Value Ref Range   Vit D, 25-Hydroxy 29.61 (L) 30 - 100 ng/mL  Comprehensive metabolic panel   Collection Time: 08/13/23  9:02 AM  Result Value Ref  Range   Sodium 138 135 - 145 mmol/L   Potassium 4.4 3.5 - 5.1 mmol/L   Chloride 104 98 - 111 mmol/L   CO2 25 22 - 32 mmol/L   Glucose, Bld 98 70 - 99 mg/dL   BUN 15 8 - 23 mg/dL   Creatinine, Ser 1.61 0.44 - 1.00 mg/dL   Calcium 9.4 8.9 - 09.6 mg/dL   Total Protein 7.1 6.5 - 8.1 g/dL   Albumin 3.9 3.5 - 5.0 g/dL   AST 56 (H) 15 - 41 U/L   ALT 67 (H) 0 - 44 U/L   Alkaline Phosphatase 79 38 - 126 U/L   Total Bilirubin 2.1 (H) 0.0 - 1.2 mg/dL   GFR, Estimated >04 >54 mL/min   Anion gap 9 5 - 15  Lipid panel   Collection Time: 08/13/23  9:02 AM  Result Value Ref Range   Cholesterol 194 0 - 200 mg/dL   Triglycerides 80 <098 mg/dL   HDL 76 >11 mg/dL   Total CHOL/HDL Ratio 2.6 RATIO   VLDL 16 0 - 40 mg/dL   LDL Cholesterol 914 (H) 0 - 99 mg/dL      Assessment & Plan:    Encounter Diagnoses  Name Primary?   Asthma, chronic, unspecified asthma severity, uncomplicated Yes   Osteoporosis, unspecified osteoporosis type, unspecified pathological fracture presence    Hyperlipidemia, unspecified hyperlipidemia type    Vitamin D deficiency      Asthma -pt will continue with prn albuterol.  Her advair will change to [facility] due to availability from [facility]  Hyperlipidemia -reviewed labs with pt -continue simvastatin and lowfat diet  Osteoporosis -pt to continue supplemental calcium -she called office after she got home and said she was taking alendronate (fosamax) 70mg  weekly.  She can continue this  Vit D def -pt to start OTC supplemental vitamin D  Elevated LFTs/bilirubin -will monitor.  Additional testing if continues to increase  -pt to follow up 3 months. She is to contact office sooner prn

## 2023-11-15 ENCOUNTER — Ambulatory Visit: Payer: Self-pay | Admitting: Physician Assistant

## 2023-11-24 ENCOUNTER — Ambulatory Visit: Payer: Self-pay | Admitting: Physician Assistant

## 2023-11-24 ENCOUNTER — Encounter: Payer: Self-pay | Admitting: Physician Assistant

## 2023-11-24 VITALS — BP 109/61 | HR 78 | Temp 97.3°F | Ht 61.25 in | Wt 150.5 lb

## 2023-11-24 DIAGNOSIS — J45909 Unspecified asthma, uncomplicated: Secondary | ICD-10-CM

## 2023-11-24 DIAGNOSIS — M810 Age-related osteoporosis without current pathological fracture: Secondary | ICD-10-CM

## 2023-11-24 DIAGNOSIS — E559 Vitamin D deficiency, unspecified: Secondary | ICD-10-CM

## 2023-11-24 DIAGNOSIS — E785 Hyperlipidemia, unspecified: Secondary | ICD-10-CM

## 2023-11-24 MED ORDER — FLUTICASONE FUROATE-VILANTEROL 100-25 MCG/ACT IN AEPB: 1.0000 | INHALATION_SPRAY | Freq: Every day | RESPIRATORY_TRACT | 1 refills | Status: AC

## 2023-11-24 NOTE — Progress Notes (Signed)
 BP 109/61   Pulse 78   Temp (!) 97.3 F (36.3 C)   Ht 5' 1.25" (1.556 m)   Wt 150 lb 8 oz (68.3 kg)   LMP 07/20/2006   SpO2 96%   BMI 28.21 kg/m    Subjective:    Patient ID: Brenda Olsen, female    DOB: 1960/09/05, 63 y.o.   MRN: [id number]  HPI: Dail Sicairos is a 63 y.o. female presenting on 11/24/2023 for Asthma   HPI  Chief Complaint  Patient presents with   Asthma    Pt is 62yoF in today for routine follow up.  Her medicare starts 12/19/23 and she has decided where she will transfer care to.   She says she is doing well and her breathing is good and she has no complaints.  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   Relevant past medical, surgical, family and social history reviewed and updated as indicated. Interim medical history since our last visit reviewed. Allergies and medications reviewed and updated.   Current Outpatient Medications:    albuterol  (VENTOLIN  HFA) 108 (90 Base) MCG/ACT inhaler, Inhale 2 puffs into the lungs every 6 (six) hours as needed for wheezing or shortness of breath. INHALE 2 PUFFS BY MOUTH EVERY 6 HOURS AS NEEDED FOR COUGHING, WHEEZING, OR SHORTNESS OF BREATH, Disp: 3 each, Rfl: 1   alendronate  (FOSAMAX ) 70 MG tablet, Take 1 tablet (70 mg total) by mouth every 7 (seven) days. Take with a full glass of water  on an empty stomach., Disp: 12 tablet, Rfl: 1   CALCIUM-VITAMIN D  PO, Take by mouth., Disp: , Rfl:    Cholecalciferol (VITAMIN D3) 125 MCG (5000 UT) CAPS, Take 1 capsule (5,000 Units total) by mouth daily., Disp: , Rfl:    diphenhydrAMINE  (BENADRYL ) 25 MG tablet, Take 25 mg by mouth every 6 (six) hours as needed for itching or allergies., Disp: , Rfl:    omeprazole  (PRILOSEC) 20 MG capsule, Take 1 capsule (20 mg total) by mouth daily as needed., Disp: 90 capsule, Rfl: 1   simvastatin  (ZOCOR ) 20 MG tablet, Take 1 tablet (20 mg total) by mouth at bedtime., Disp: 90 tablet, Rfl: 3   fluticasone  furoate-vilanterol (BREO ELLIPTA )  100-25 MCG/ACT AEPB, Inhale 1 puff into the lungs daily. (Patient not taking: Reported on 11/24/2023), Disp: 3 each, Rfl: 1    Review of Systems  Per HPI unless specifically indicated above     Objective:    BP 109/61   Pulse 78   Temp (!) 97.3 F (36.3 C)   Ht 5' 1.25" (1.556 m)   Wt 150 lb 8 oz (68.3 kg)   LMP 07/20/2006   SpO2 96%   BMI 28.21 kg/m   Wt Readings from Last 3 Encounters:  11/24/23 150 lb 8 oz (68.3 kg)  02/15/23 148 lb 8 oz (67.4 kg)  11/16/22 151 lb 8 oz (68.7 kg)    Physical Exam Vitals reviewed.  Constitutional:      General: She is not in acute distress.    Appearance: She is well-developed. She is not toxic-appearing.  HENT:     Head: Normocephalic and atraumatic.  Cardiovascular:     Rate and Rhythm: Normal rate and regular rhythm.  Pulmonary:     Effort: Pulmonary effort is normal. No respiratory distress.     Breath sounds: Normal breath sounds. No wheezing.  Abdominal:     General: Bowel sounds are normal.     Palpations: Abdomen is soft. There  is no mass.     Tenderness: There is no abdominal tenderness.  Musculoskeletal:     Cervical back: Neck supple.     Right lower leg: No edema.     Left lower leg: No edema.  Lymphadenopathy:     Cervical: No cervical adenopathy.  Skin:    General: Skin is warm and dry.  Neurological:     Mental Status: She is alert and oriented to person, place, and time.  Psychiatric:        Behavior: Behavior normal.         Assessment & Plan:   Encounter Diagnoses  Name Primary?   Asthma, chronic, unspecified asthma severity, uncomplicated Yes   Osteoporosis, unspecified osteoporosis type, unspecified pathological fracture presence    Vitamin D  deficiency    Hyperlipidemia, unspecified hyperlipidemia type       -no changes today -pt Breo is refilled -pt encouraged to go ahead and get appointment scheduled with new provider

## 2023-12-14 ENCOUNTER — Other Ambulatory Visit: Payer: Self-pay | Admitting: Physician Assistant

## 2023-12-14 MED ORDER — ALBUTEROL SULFATE HFA 108 (90 BASE) MCG/ACT IN AERS: 2.0000 | INHALATION_SPRAY | Freq: Four times a day (QID) | RESPIRATORY_TRACT | 0 refills | Status: AC | PRN
